# Patient Record
Sex: Female | Born: 1970 | Race: Black or African American | Hispanic: No | Marital: Single | Smoking: Never smoker
Health system: Southern US, Community
[De-identification: ages and names within clinical notes are randomized; demographics above are authoritative.]

## PROBLEM LIST (undated history)

## (undated) DIAGNOSIS — E785 Hyperlipidemia, unspecified: Secondary | ICD-10-CM

## (undated) DIAGNOSIS — F329 Major depressive disorder, single episode, unspecified: Secondary | ICD-10-CM

## (undated) DIAGNOSIS — K219 Gastro-esophageal reflux disease without esophagitis: Secondary | ICD-10-CM

## (undated) DIAGNOSIS — F419 Anxiety disorder, unspecified: Secondary | ICD-10-CM

## (undated) DIAGNOSIS — I10 Essential (primary) hypertension: Secondary | ICD-10-CM

## (undated) DIAGNOSIS — E669 Obesity, unspecified: Secondary | ICD-10-CM

## (undated) DIAGNOSIS — G4730 Sleep apnea, unspecified: Secondary | ICD-10-CM

## (undated) DIAGNOSIS — F32A Depression, unspecified: Secondary | ICD-10-CM

## (undated) HISTORY — PX: OTHER SURGICAL HISTORY: SHX169

---
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
## 1994-07-31 HISTORY — PX: TUBAL LIGATION: SHX77

## 1997-12-07 ENCOUNTER — Other Ambulatory Visit: Admission: RE | Admit: 1997-12-07 | Discharge: 1997-12-07 | Payer: Self-pay | Admitting: Family Medicine

## 1998-12-20 ENCOUNTER — Emergency Department (HOSPITAL_COMMUNITY): Admission: EM | Admit: 1998-12-20 | Discharge: 1998-12-20 | Payer: Self-pay | Admitting: Emergency Medicine

## 1998-12-20 ENCOUNTER — Encounter: Payer: Self-pay | Admitting: Emergency Medicine

## 2002-08-19 ENCOUNTER — Other Ambulatory Visit: Admission: RE | Admit: 2002-08-19 | Discharge: 2002-08-19 | Payer: Self-pay | Admitting: Family Medicine

## 2005-10-03 ENCOUNTER — Ambulatory Visit: Payer: Self-pay | Admitting: Family Medicine

## 2005-10-06 ENCOUNTER — Ambulatory Visit: Payer: Self-pay | Admitting: Family Medicine

## 2005-10-09 ENCOUNTER — Ambulatory Visit: Payer: Self-pay | Admitting: Family Medicine

## 2006-06-01 ENCOUNTER — Ambulatory Visit: Payer: Self-pay | Admitting: Family Medicine

## 2006-06-06 ENCOUNTER — Ambulatory Visit: Payer: Self-pay | Admitting: Family Medicine

## 2006-10-30 ENCOUNTER — Ambulatory Visit: Payer: Self-pay | Admitting: Family Medicine

## 2006-11-01 ENCOUNTER — Ambulatory Visit: Payer: Self-pay | Admitting: Family Medicine

## 2006-11-13 ENCOUNTER — Ambulatory Visit: Payer: Self-pay | Admitting: Family Medicine

## 2006-11-13 ENCOUNTER — Other Ambulatory Visit: Admission: RE | Admit: 2006-11-13 | Discharge: 2006-11-13 | Payer: Self-pay | Admitting: Family Medicine

## 2006-11-13 LAB — CONVERTED CEMR LAB

## 2007-01-22 ENCOUNTER — Ambulatory Visit: Payer: Self-pay | Admitting: Family Medicine

## 2007-04-15 ENCOUNTER — Telehealth (INDEPENDENT_AMBULATORY_CARE_PROVIDER_SITE_OTHER): Payer: Self-pay | Admitting: *Deleted

## 2007-04-16 ENCOUNTER — Encounter (INDEPENDENT_AMBULATORY_CARE_PROVIDER_SITE_OTHER): Payer: Self-pay | Admitting: Family Medicine

## 2007-04-16 DIAGNOSIS — E669 Obesity, unspecified: Secondary | ICD-10-CM | POA: Insufficient documentation

## 2007-04-16 DIAGNOSIS — J45909 Unspecified asthma, uncomplicated: Secondary | ICD-10-CM | POA: Insufficient documentation

## 2007-04-16 DIAGNOSIS — Z9089 Acquired absence of other organs: Secondary | ICD-10-CM

## 2007-04-22 ENCOUNTER — Ambulatory Visit: Payer: Self-pay | Admitting: Family Medicine

## 2007-04-22 DIAGNOSIS — I10 Essential (primary) hypertension: Secondary | ICD-10-CM | POA: Insufficient documentation

## 2007-04-22 DIAGNOSIS — H9190 Unspecified hearing loss, unspecified ear: Secondary | ICD-10-CM | POA: Insufficient documentation

## 2007-04-22 DIAGNOSIS — H6690 Otitis media, unspecified, unspecified ear: Secondary | ICD-10-CM | POA: Insufficient documentation

## 2007-04-22 DIAGNOSIS — J309 Allergic rhinitis, unspecified: Secondary | ICD-10-CM | POA: Insufficient documentation

## 2007-10-19 ENCOUNTER — Emergency Department (HOSPITAL_COMMUNITY): Admission: EM | Admit: 2007-10-19 | Discharge: 2007-10-19 | Payer: Self-pay | Admitting: Emergency Medicine

## 2008-04-09 ENCOUNTER — Emergency Department (HOSPITAL_COMMUNITY): Admission: EM | Admit: 2008-04-09 | Discharge: 2008-04-10 | Payer: Self-pay | Admitting: Family Medicine

## 2008-06-15 ENCOUNTER — Ambulatory Visit: Payer: Self-pay | Admitting: Family Medicine

## 2008-06-15 DIAGNOSIS — R002 Palpitations: Secondary | ICD-10-CM

## 2008-06-17 LAB — CONVERTED CEMR LAB
ALT: 8 units/L (ref 0–35)
Albumin: 4.1 g/dL (ref 3.5–5.2)
Alkaline Phosphatase: 55 units/L (ref 39–117)
Amphetamine Screen, Ur: NEGATIVE
Barbiturate Quant, Ur: NEGATIVE
Basophils Absolute: 0.1 10*3/uL (ref 0.0–0.1)
CO2: 21 meq/L (ref 19–32)
Eosinophils Relative: 4 % (ref 0–5)
Glucose, Bld: 101 mg/dL — ABNORMAL HIGH (ref 70–99)
HCT: 47.5 % — ABNORMAL HIGH (ref 36.0–46.0)
Lymphocytes Relative: 34 % (ref 12–46)
Lymphs Abs: 3.3 10*3/uL (ref 0.7–4.0)
Marijuana Metabolite: NEGATIVE
Methadone: NEGATIVE
Neutrophils Relative %: 54 % (ref 43–77)
Platelets: 422 10*3/uL — ABNORMAL HIGH (ref 150–400)
Potassium: 4.4 meq/L (ref 3.5–5.3)
Propoxyphene: NEGATIVE
RDW: 12.4 % (ref 11.5–15.5)
Sodium: 142 meq/L (ref 135–145)
TSH: 0.645 microintl units/mL (ref 0.350–4.50)
Total Bilirubin: 0.5 mg/dL (ref 0.3–1.2)
Total Protein: 7.6 g/dL (ref 6.0–8.3)
WBC: 9.6 10*3/uL (ref 4.0–10.5)

## 2008-06-24 ENCOUNTER — Ambulatory Visit: Payer: Self-pay | Admitting: Nurse Practitioner

## 2008-06-24 DIAGNOSIS — R42 Dizziness and giddiness: Secondary | ICD-10-CM | POA: Insufficient documentation

## 2008-06-24 LAB — CONVERTED CEMR LAB
Nitrite: NEGATIVE
Protein, U semiquant: NEGATIVE
Urobilinogen, UA: 0.2

## 2008-07-30 ENCOUNTER — Ambulatory Visit: Payer: Self-pay | Admitting: Family Medicine

## 2008-08-03 LAB — CONVERTED CEMR LAB
BUN: 11 mg/dL (ref 6–23)
Bilirubin, Direct: 0.2 mg/dL (ref 0.0–0.3)
CO2: 18 meq/L — ABNORMAL LOW (ref 19–32)
Chloride: 105 meq/L (ref 96–112)
Glucose, Bld: 97 mg/dL (ref 70–99)
Indirect Bilirubin: 0.4 mg/dL (ref 0.0–0.9)
LDL Cholesterol: 161 mg/dL — ABNORMAL HIGH (ref 0–99)
Potassium: 3.7 meq/L (ref 3.5–5.3)
TSH: 0.863 microintl units/mL (ref 0.350–4.50)
Total Bilirubin: 0.6 mg/dL (ref 0.3–1.2)
Total Protein: 7.8 g/dL (ref 6.0–8.3)
Triglycerides: 105 mg/dL (ref <150)
VLDL: 21 mg/dL (ref 0–40)

## 2009-05-06 ENCOUNTER — Encounter: Payer: Self-pay | Admitting: Physician Assistant

## 2009-05-11 ENCOUNTER — Telehealth: Payer: Self-pay | Admitting: Physician Assistant

## 2009-05-20 ENCOUNTER — Ambulatory Visit: Payer: Self-pay | Admitting: Nurse Practitioner

## 2009-05-20 ENCOUNTER — Encounter: Payer: Self-pay | Admitting: Physician Assistant

## 2009-05-31 LAB — CONVERTED CEMR LAB
ALT: 8 units/L (ref 0–35)
AST: 13 units/L (ref 0–37)
Albumin: 3.7 g/dL (ref 3.5–5.2)
CO2: 22 meq/L (ref 19–32)
Calcium: 9.1 mg/dL (ref 8.4–10.5)
HDL: 43 mg/dL (ref >39)
Potassium: 4.4 meq/L (ref 3.5–5.3)
Sodium: 141 meq/L (ref 135–145)
Total Bilirubin: 0.8 mg/dL (ref 0.3–1.2)
Total CHOL/HDL Ratio: 5

## 2009-10-04 ENCOUNTER — Ambulatory Visit: Payer: Self-pay | Admitting: Physician Assistant

## 2009-10-04 ENCOUNTER — Other Ambulatory Visit: Admission: RE | Admit: 2009-10-04 | Discharge: 2009-10-04 | Payer: Self-pay | Admitting: Internal Medicine

## 2009-10-04 DIAGNOSIS — K219 Gastro-esophageal reflux disease without esophagitis: Secondary | ICD-10-CM | POA: Insufficient documentation

## 2009-10-04 DIAGNOSIS — R82998 Other abnormal findings in urine: Secondary | ICD-10-CM | POA: Insufficient documentation

## 2009-10-04 DIAGNOSIS — N760 Acute vaginitis: Secondary | ICD-10-CM | POA: Insufficient documentation

## 2009-10-04 DIAGNOSIS — F411 Generalized anxiety disorder: Secondary | ICD-10-CM | POA: Insufficient documentation

## 2009-10-04 DIAGNOSIS — E785 Hyperlipidemia, unspecified: Secondary | ICD-10-CM

## 2009-10-05 ENCOUNTER — Encounter: Payer: Self-pay | Admitting: Physician Assistant

## 2009-10-08 ENCOUNTER — Encounter: Payer: Self-pay | Admitting: Physician Assistant

## 2009-10-18 ENCOUNTER — Ambulatory Visit: Payer: Self-pay | Admitting: Physician Assistant

## 2009-10-19 ENCOUNTER — Ambulatory Visit: Payer: Self-pay | Admitting: Physician Assistant

## 2009-10-28 ENCOUNTER — Ambulatory Visit: Payer: Self-pay | Admitting: Physician Assistant

## 2009-10-29 ENCOUNTER — Encounter: Payer: Self-pay | Admitting: Physician Assistant

## 2009-11-09 ENCOUNTER — Ambulatory Visit: Payer: Self-pay | Admitting: Physician Assistant

## 2009-11-25 ENCOUNTER — Ambulatory Visit: Payer: Self-pay | Admitting: Physician Assistant

## 2009-12-07 ENCOUNTER — Ambulatory Visit: Payer: Self-pay | Admitting: Physician Assistant

## 2010-02-01 ENCOUNTER — Emergency Department (HOSPITAL_COMMUNITY): Admission: EM | Admit: 2010-02-01 | Discharge: 2010-02-01 | Payer: Self-pay | Admitting: Emergency Medicine

## 2010-02-03 ENCOUNTER — Telehealth: Payer: Self-pay | Admitting: Physician Assistant

## 2010-03-21 ENCOUNTER — Telehealth: Payer: Self-pay | Admitting: Physician Assistant

## 2010-04-12 ENCOUNTER — Ambulatory Visit: Payer: Self-pay | Admitting: Physician Assistant

## 2010-04-12 LAB — CONVERTED CEMR LAB
BUN: 14 mg/dL (ref 6–23)
CO2: 25 meq/L (ref 19–32)
Calcium: 9.2 mg/dL (ref 8.4–10.5)
Chloride: 106 meq/L (ref 96–112)
Creatinine, Ser: 0.88 mg/dL (ref 0.40–1.20)
Glucose, Bld: 81 mg/dL (ref 70–99)
TSH: 0.466 microintl units/mL (ref 0.350–4.500)

## 2010-04-13 ENCOUNTER — Encounter: Payer: Self-pay | Admitting: Physician Assistant

## 2010-05-02 ENCOUNTER — Ambulatory Visit: Payer: Self-pay | Admitting: Physician Assistant

## 2010-05-02 DIAGNOSIS — N3000 Acute cystitis without hematuria: Secondary | ICD-10-CM

## 2010-05-02 LAB — CONVERTED CEMR LAB
Glucose, Urine, Semiquant: NEGATIVE
Nitrite: POSITIVE
Urobilinogen, UA: 0.2

## 2010-05-03 ENCOUNTER — Encounter: Payer: Self-pay | Admitting: Physician Assistant

## 2010-05-16 ENCOUNTER — Encounter: Payer: Self-pay | Admitting: Physician Assistant

## 2010-05-17 ENCOUNTER — Ambulatory Visit: Payer: Self-pay | Admitting: Nurse Practitioner

## 2010-07-08 ENCOUNTER — Ambulatory Visit: Payer: Self-pay | Admitting: Nurse Practitioner

## 2010-08-12 ENCOUNTER — Ambulatory Visit: Admission: RE | Admit: 2010-08-12 | Payer: Self-pay | Source: Home / Self Care | Admitting: Nurse Practitioner

## 2010-08-28 LAB — CONVERTED CEMR LAB
Bilirubin Urine: NEGATIVE
KOH Prep: NEGATIVE
Nitrite: NEGATIVE
Specific Gravity, Urine: 1.025
Urobilinogen, UA: 1
pH: 5.5

## 2010-08-30 NOTE — Letter (Signed)
Summary: COUNSELING  COUNSELING   Imported By: Arta Bruce 12/13/2009 11:04:59  _____________________________________________________________________  External Attachment:    Type:   Image     Comment:   External Document

## 2010-08-30 NOTE — Assessment & Plan Note (Signed)
Summary: 4 WEEK FU///KT   Vital Signs:  Patient profile:   40 year old female Height:      66 inches Weight:      311 pounds Temp:     98.3 degrees F oral Pulse rate:   88 / minute Pulse rhythm:   regular Resp:     20 per minute BP sitting:   123 / 79  (left arm) Cuff size:   large  Vitals Entered By: Danielle Rojas (Dec 07, 2009 2:41 PM) CC: follow-up visit(4week) Is Patient Diabetic? No Pain Assessment Patient in pain? no       Does patient need assistance? Functional Status Self care Ambulation Normal   Primary Care Provider:  Tereso Newcomer PA-C  CC:  follow-up visit(4week).  History of Present Illness: Here for f/u. Tells me that Danielle Rojas has referred her to psych at [facility].  Has appt later this month. She states that she has less palps and feels like she is scared somewhat less. Still thinks she sees things that are not there. Denies any instructions given to her by her hallucinations. Has some symptoms of agoraphobia.  No suicidal thoughts.   Current Medications (verified): 1)  Proventil Hfa 108 (90 Base) Mcg/act Aers (Albuterol Sulfate) .Marland Kitchen.. 1-2 Puffs Every 4-6 Hours As Needed 2)  Loratadine 10 Mg Tabs (Loratadine) .Marland Kitchen.. 1 Tablet By Mouth Daily For Allergies 3)  Simvastatin 40 Mg Tabs (Simvastatin) .... Take 1 Tab By Mouth At Bedtime (Dr. Sharyn Lull) 4)  Metoprolol Succinate 100 Mg Xr24h-Tab (Metoprolol Succinate) .... Take 1 Tablet By Mouth Once A Day (Dr. Sharyn Lull) 5)  Omeprazole 20 Mg Cpdr (Omeprazole) .... Take 1 Tablet By Mouth Once A Day (Dr. Sharyn Lull) 6)  Zoloft 100 Mg Tabs (Sertraline Hcl) .... Take 1 Tablet By Mouth Once A Day  Allergies (verified): No Known Drug Allergies  Physical Exam  General:  alert and well-developed.   Head:  normocephalic and atraumatic.   Lungs:  normal breath sounds.   Heart:  normal rate and regular rhythm.   Neurologic:  alert & oriented X3 and cranial nerves II-XII intact.   Skin:  sebaceous cyst left chest  Psych:   not anxious appearing, not suicidal, and flat affect.     Impression & Recommendations:  Problem # 1:  ANXIETY (ICD-300.00) slight improvement with Zoloft does not seem to have as severe of panic attacks with med ? hallucinations are the same no deterioration in her mood no suicidal thoughts still seeing Danielle Rojas and has appt soon is being referred to psych and sees them in 2 weeks will hold off on changing any meds at this time and allow psych to eval and adjust meds accordingly  Her updated medication list for this problem includes:    Zoloft 100 Mg Tabs (Sertraline hcl) .Marland Kitchen... Take 1 tablet by mouth once a day  Complete Medication List: 1)  Proventil Hfa 108 (90 Base) Mcg/act Aers (Albuterol sulfate) .Marland Kitchen.. 1-2 puffs every 4-6 hours as needed 2)  Loratadine 10 Mg Tabs (Loratadine) .Marland Kitchen.. 1 tablet by mouth daily for allergies 3)  Simvastatin 40 Mg Tabs (Simvastatin) .... Take 1 tab by mouth at bedtime (dr. Sharyn Lull) 4)  Metoprolol Succinate 100 Mg Xr24h-tab (Metoprolol succinate) .... Take 1 tablet by mouth once a day (dr. Sharyn Lull) 5)  Omeprazole 20 Mg Cpdr (Omeprazole) .... Take 1 tablet by mouth once a day (dr. Sharyn Lull) 6)  Zoloft 100 Mg Tabs (Sertraline hcl) .... Take 1 tablet by mouth once a day  Patient Instructions: 1)  Continue same dose of Zoloft.  2)  See Danielle Rojas as scheduled. 3)  Keep initial appointment at the [facility]. 4)  Please schedule a follow-up appointment in 2 months with Danielle Rojas for anxiety and blood pressure. 5)

## 2010-08-30 NOTE — Progress Notes (Signed)
  Phone Note Call from Patient   Summary of Call: THE PT HAS STEP THROAT AND SHE WANTS TO BE SEEN AS POSSIBLE.  PT WENT TO THE HOSPITAL ON TUESDAY (AND GOT ANTIBIOTIC SHOT) BUT SHE IS UNDER A LOT OF PAIN.  [facility] [address] WEAVER PA-C  Initial call taken by: Manon Hilding,  February 03, 2010 4:11 PM  Follow-up for Phone Call        spokle with pt and she let me know she did recieved a antobotic shot of strep throat when she went to hospital on tuesday evening... pt says her symptoms are her haed and throat is hurting... [place] Shannon  February 04, 2010 4:27 PM i did advised pt to go to urgnet care since it was real close to 5... pt says she will go and give [place] a call on monday if she doesn't feel any better Follow-up by: [place] Shannon,  February 04, 2010 4:29 PM

## 2010-08-30 NOTE — Assessment & Plan Note (Signed)
Summary: UTI   Vital Signs:  Patient profile:   40 year old female Menstrual status:  regular LMP:     04/13/2010 Weight:      312.9 pounds Temp:     98.6 degrees F oral Pulse rate:   80 / minute Pulse rhythm:   regular Resp:     18 per minute BP sitting:   116 / 78  (left arm) Cuff size:   thigh  Vitals Entered By: Michelle Nasuti (May 02, 2010 2:03 PM) CC: pt c/o increase in urinary urgency and painful urination x 1 week. denies vag dc, odor, back pain, or lower abd. pain  Pain Assessment Patient in pain? no      LMP (date): 04/13/2010 LMP - Character: normal     Menstrual Status regular Enter LMP: 04/13/2010 Last PAP Result NEGATIVE FOR INTRAEPITHELIAL LESIONS OR MALIGNANCY.   Primary Care Provider:  Tereso Newcomer PA-C  CC:  pt c/o increase in urinary urgency and painful urination x 1 week. denies vag dc, odor, back pain, and or lower abd. pain .  History of Present Illness: Here for 1 week of urinary frequency and urgency.  Notes dysuria.  No hematuria.  No flank pain.  No fever or chills.  No recent antibxs.  Denies any allergies.  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  Physical Exam  General:  alert, well-developed, and well-nourished.   Head:  normocephalic and atraumatic.     Lungs:  normal breath sounds.   Heart:  normal rate and regular rhythm.   Abdomen:  soft and non-tender.   Msk:  no CVA tend to percussion bilat  Neurologic:  alert & oriented X3 and cranial nerves II-XII intact.   Psych:  normally interactive.     Impression & Recommendations:  Problem # 1:  ACUTE CYSTITIS (ICD-595.0)  check culture tx with bactrim x 3 days rpeat u/a in 2 weeks  Her updated medication list for this problem includes:    Bactrim Ds 800-160 Mg Tabs (Sulfamethoxazole-trimethoprim) .Marland Kitchen... Take 1 tablet by mouth two times a day  Orders: UA Dipstick w/o Micro (manual) (81191) T-Culture, Urine ([address]29-56213)  Complete Medication List: 1)  Proventil Hfa 108 (90 Base) Mcg/act Aers (Albuterol sulfate) .Marland Kitchen.. 1-2 puffs every 4-6 hours as needed 2)  Loratadine 10 Mg Tabs (Loratadine) .Marland Kitchen.. 1 tablet by mouth daily for allergies 3)  Simvastatin 40 Mg Tabs (Simvastatin) .... Take 1 tab by mouth at bedtime (dr. Sharyn Lull) 4)  Metoprolol Succinate 100 Mg Xr24h-tab (Metoprolol succinate) .... Take 1 tablet by mouth once a day (dr. Sharyn Lull) 5)  Omeprazole 20 Mg Cpdr (Omeprazole) .... Take 1 tablet by mouth once a day (dr. Sharyn Lull) 6)  Zoloft 100 Mg Tabs (Sertraline hcl) .... Take 1 and 1/2 tablets once daily 7)  Prinivil 10  Mg Tabs (Lisinopril) .Marland Kitchen.. 1 tablet by mouth twice daily 8)  Hydroxyzine Hcl 25 Mg Tabs (Hydroxyzine hcl) .... Take 1 tablet by mouth once a day as needed for anxiety 9)  Bactrim Ds 800-160 Mg Tabs (Sulfamethoxazole-trimethoprim) .... Take 1 tablet by mouth two times a day  Patient Instructions: 1)  Drink plenty of fluids up to 3-4 quarts a day. Cranberry juice is especially recommended in addition to large amounts of water. Avoid caffeine & carbonated drinks, they tend to irritate the bladder, Return in 3-5 days if you're not better: sooner if you're feeling worse.  2)  Take your antibiotic as prescribed until ALL of it is gone, but stop if you develop a rash or  swelling and contact our office as soon as possible.  3)  Return for repeat urinalysis in 2 weeks. Prescriptions: BACTRIM DS 800-160 MG TABS (SULFAMETHOXAZOLE-TRIMETHOPRIM) Take 1 tablet by mouth two times a day  #6 x 0   Entered and Authorized by:   Tereso Newcomer PA-C   Signed by:   Tereso Newcomer PA-C on 05/02/2010   Method used:   Print then Give to Patient   RxID:   [id number]   Laboratory Results   Urine Tests  Date/Time Received: May 02, 2010 Date/Time Reported: 2:15 PM   Routine Urinalysis   Color: yellow Appearance: Hazy Glucose: negative   (Normal Range: Negative) Bilirubin: negative   (Normal Range: Negative) Ketone: negative   (Normal Range: Negative) Spec. Gravity: >=1.030   (Normal Range: 1.003-1.035) Blood: small   (Normal Range: Negative) pH: 5.5   (Normal Range: 5.0-8.0) Protein: 30   (Normal Range: Negative) Urobilinogen: 0.2   (Normal Range: 0-1) Nitrite: positive   (Normal Range: Negative) Leukocyte Esterace: moderate   (Normal Range: Negative)

## 2010-08-30 NOTE — Assessment & Plan Note (Signed)
Summary: Anxiety and HTN   Vital Signs:  Patient profile:   40 year old female Height:      66 inches Weight:      312 pounds BMI:     50.54 Temp:     98.5 degrees F oral Pulse rate:   78 / minute Pulse rhythm:   regular Resp:     20 per minute BP sitting:   108 / 72  (left arm)  Vitals Entered By: CMA Student Linzie Collin CC: follow-up visit for BP, rashes behind the ear for about four months, Hypertension Management Is Patient Diabetic? No Pain Assessment Patient in pain? no       Does patient need assistance? Functional Status Self care Ambulation Normal   Primary Care Provider:  Tereso Newcomer PA-C  CC:  follow-up visit for BP, rashes behind the ear for about four months, and Hypertension Management.  History of Present Illness: Here for f/u. Only saw [facility] x 1.  Was told did not need to come back.  Still has a lot of fear.  ? panic attacks.  Has ? hallucinations still.  Feels like mood is ok.  Has not seen [facility].  Hypertension History:      She complains of headache, but denies chest pain, dyspnea with exertion, and syncope.  She notes no problems with any antihypertensive medication side effects.  Further comments include: Lisinopril added recently by cardiology.        Positive major cardiovascular risk factors include hyperlipidemia, hypertension, and current tobacco user.  Negative major cardiovascular risk factors include female age less than 81 years old and negative family history for ischemic heart disease.     Current Medications (verified): 1)  Proventil Hfa 108 (90 Base) Mcg/act Aers (Albuterol Sulfate) .Marland Kitchen.. 1-2 Puffs Every 4-6 Hours As Needed 2)  Loratadine 10 Mg Tabs (Loratadine) .Marland Kitchen.. 1 Tablet By Mouth Daily For Allergies 3)  Simvastatin 40 Mg Tabs (Simvastatin) .... Take 1 Tab By Mouth At Bedtime (Dr. Sharyn Lull) 4)  Metoprolol Succinate 100 Mg Xr24h-Tab (Metoprolol Succinate) .... Take 1 Tablet By Mouth Once A Day (Dr. Sharyn Lull) 5)  Omeprazole 20  Mg Cpdr (Omeprazole) .... Take 1 Tablet By Mouth Once A Day (Dr. Sharyn Lull) 6)  Zoloft 100 Mg Tabs (Sertraline Hcl) .... Take 1 Tablet By Mouth Once A Day 7)  Prinivil 10 Mg Tabs (Lisinopril) .Marland Kitchen.. 1 Tablet By Mouth Twice Daily  Allergies (verified): No Known Drug Allergies  Physical Exam  General:  alert and well-developed.   Head:  normocephalic and atraumatic.   Lungs:  normal breath sounds.   Heart:  normal rate and regular rhythm.   Abdomen:  soft, non-tender, normal bowel sounds, and no hepatomegaly.   Neurologic:  alert & oriented X3 and cranial nerves II-XII intact.   Psych:  normally interactive.     Impression & Recommendations:  Problem # 1:  ANXIETY (ICD-300.00) still scared about certain situations ? visual hallucinations never set up for routine care at [facility] . . . saw someone once only will increase zoloft to 150 add hydroxyzine to use as needed will send back to counselor  Her updated medication list for this problem includes:    Zoloft 100 Mg Tabs (Sertraline hcl) .Marland Kitchen... Take 1 and 1/2 tablets once daily    Hydroxyzine Hcl 25 Mg Tabs (Hydroxyzine hcl) .Marland Kitchen... Take 1 tablet by mouth once a day as needed for anxiety  Orders: T-TSH ([id number])  Problem # 2:  HYPERTENSION (ICD-401.9) controlled  Her  updated medication list for this problem includes:    Metoprolol Succinate 100 Mg Xr24h-tab (Metoprolol succinate) .Marland Kitchen... Take 1 tablet by mouth once a day (dr. Sharyn Lull)    Prinivil 10 Mg Tabs (Lisinopril) .Marland Kitchen... 1 tablet by mouth twice daily  Orders: T-Comprehensive Metabolic Panel ([id number]) T-TSH ([id number])  Problem # 3:  URINALYSIS, ABNORMAL (ICD-791.9) had protein in urine last time can't urinate will check next time  Complete Medication List: 1)  Proventil Hfa 108 (90 Base) Mcg/act Aers (Albuterol sulfate) .Marland Kitchen.. 1-2 puffs every 4-6 hours as needed 2)  Loratadine 10 Mg Tabs (Loratadine) .Marland Kitchen.. 1 tablet by mouth daily for allergies 3)  Simvastatin 40 Mg  Tabs (Simvastatin) .... Take 1 tab by mouth at bedtime (dr. Sharyn Lull) 4)  Metoprolol Succinate 100 Mg Xr24h-tab (Metoprolol succinate) .... Take 1 tablet by mouth once a day (dr. Sharyn Lull) 5)  Omeprazole 20 Mg Cpdr (Omeprazole) .... Take 1 tablet by mouth once a day (dr. Sharyn Lull) 6)  Zoloft 100 Mg Tabs (Sertraline hcl) .... Take 1 and 1/2 tablets once daily 7)  Prinivil 10 Mg Tabs (Lisinopril) .Marland Kitchen.. 1 tablet by mouth twice daily 8)  Hydroxyzine Hcl 25 Mg Tabs (Hydroxyzine hcl) .... Take 1 tablet by mouth once a day as needed for anxiety  Hypertension Assessment/Plan:      The patient's hypertensive risk group is category B: At least one risk factor (excluding diabetes) with no target organ damage.  Her calculated 10 year risk of coronary heart disease is 1 %.  Today's blood pressure is 108/72.  Her blood pressure goal is < 140/90.  Patient Instructions: 1)  Schedule follow up appt. with Ethelene Browns. 2)  Please schedule a follow-up appointment in 2 months with Jahmir Salo for anxiety.  Prescriptions: HYDROXYZINE HCL 25 MG TABS (HYDROXYZINE HCL) Take 1 tablet by mouth once a day as needed for anxiety  #30 x 0   Entered and Authorized by:   Tereso Newcomer PA-C   Signed by:   Tereso Newcomer PA-C on 04/12/2010   Method used:   Print then Give to Patient   RxID:   [id number] ZOLOFT 100 MG TABS (SERTRALINE HCL) Take 1 and 1/2 tablets once daily  #45 x 5   Entered and Authorized by:   Tereso Newcomer PA-C   Signed by:   Tereso Newcomer PA-C on 04/12/2010   Method used:   Print then Give to Patient   RxID:   [id number]

## 2010-08-30 NOTE — Letter (Signed)
Summary: *HSN Results Follow up  [facility]  [address] [place], [place] [address]   Phone: [phone]  Fax: [phone]      04/13/2010   [facility] [address] [place], [place]  [address]   Dear  Ms. [name],                            ____S.Drinkard,FNP   ____D. Gore,FNP       ____B. McPherson,MD   ____V. Rankins,MD    ____E. Mulberry,MD    ____N. Daphine Deutscher, FNP  ____D. Reche Dixon, MD    ____K. Philipp Deputy, MD    __x__S. Alben Spittle, PA-C     This letter is to inform you that your recent test(s):  _______Pap Smear    ___x____Lab Test     _______X-ray    ___x____ is within acceptable limits  _______ requires a medication change  _______ requires a follow-up lab visit  _______ requires a follow-up visit with your provider   Comments: Liver and kidney function and thyroid all normal.       _________________________________________________________ If you have any questions, please contact our office                     Sincerely,  Tereso Newcomer PA-C [facility]

## 2010-08-30 NOTE — Assessment & Plan Note (Signed)
Summary: Danielle Rojas PT/CPP///////RJP   Vital Signs:  Patient profile:   40 year old female Weight:      315.6 pounds BMI:     51.12 Temp:     98.9 degrees F Pulse rate:   101 / minute Pulse rhythm:   regular Resp:     20 per minute BP sitting:   132 / 84  (left arm) Cuff size:   large  Vitals Entered By: Vesta Mixer CMA (October 04, 2009 10:41 AM)  Serial Vital Signs/Assessments:  Comments: 12:16 PM Peak flows 1. 340  2. 345   3. 430 By: Vesta Mixer CMA    Primary Care Provider:  Beverley Fiedler MD   History of Present Illness: Here for CPP.  Palps:  Sent to cardiology when last seen in 2009.  Sees Dr. Sharyn Lull.  He has her on Metoprolol Succinate 100 mg once daily.  She just had an Echo recently.  No records rec'd.   High chol:  She is on simvastatin per Dr. Sharyn Lull.  He follows up on this.  GERD:  Dr. Sharyn Lull put her on Prilosec.  Takes as needed.  Symptoms controlled. No melena, hematochezia or hematemesis.    Anxiety:  Gets scared at times.  Has noticed since she started having palps.  She says she tenses up and gets scared.  Sounds like she is having panic attacks.  She gets scared coming into the doctor's office or driving down the highway.    Health maint:   No h/o abnormal paps.  Last in 2008. No abnormal bleeding, discharge, odor. No irregular periods. Sexually active with one partner.  No STD concerns.  No h/o STD. Does not take calcium. No FHx of breast or ovarian cancer. Td up to date. No flu shot this year.   Asthma History    Asthma Control Assessment:    Age range: 12+ years    Symptoms: 0-2 days/week    Nighttime Awakenings: 0-2/month    Interferes w/ normal activity: no limitations    SABA use (not for EIB): 0-2 days/week    Asthma Control Assessment: Well Controlled  Hypertension History:      Positive major cardiovascular risk factors include hyperlipidemia, hypertension, and current tobacco user.  Negative major cardiovascular risk  factors include female age less than 64 years old and negative family history for ischemic heart disease.     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) 10)  Otitis Media, Acute, Left  (ICD-382.9) 11)  Hypertension  (ICD-401.9) 12)  Rhinitis, Allergic Nos  (ICD-477.9) 13)  Cholecystectomy, Hx of  (ICD-V45.79) 14)  Tubal Ligation, Hx of  (ICD-V26.51) 15)  Obesity  (ICD-278.00) 16)  Asthma  (ICD-493.90)  Allergies: No Known Drug Allergies  Past History:  Past Medical History: Last updated: 04/16/2007 Current Problems:  RHINITIS, ALLERGIC NOS (ICD-477.9) CHOLECYSTECTOMY, HX OF (ICD-V45.79) TUBAL LIGATION, HX OF (ICD-V26.51) OBESITY (ICD-278.00) ASTHMA (ICD-493.90)  Past Surgical History: Last updated: 04/16/2007 Cholecystectomy Tubal ligation  Family History: Dad - brain and lung cancer No family history of thyroid problems.  Family History Diabetes 1st degree relative - mom Family History Hypertension - mom  Social History: Single Current Smoker - quit 2009  Alcohol use-no Drug use-no unemployed 3 kids  Review of Systems      See HPI General:  Denies chills and fever. CV:  Complains of palpitations; denies chest pain or discomfort, difficulty breathing while lying down, and fainting. Resp:  Denies cough. GI:  Denies bloody stools, dark tarry stools, and vomiting blood. GU:  Denies dysuria and hematuria. Derm:  Denies lesion(s). Psych:  Denies suicidal thoughts/plans. Endo:  Denies cold intolerance and heat intolerance.  Physical Exam  General:  alert, well-developed, and well-nourished.   Head:  normocephalic and atraumatic.   Eyes:  pupils equal,  pupils round, pupils reactive to light, and no retinal abnormalitiies.   Ears:  R ear normal and L ear normal.   Nose:  no external deformity.   Mouth:  pharynx pink and moist, no erythema, and no exudates.   Neck:  supple, no thyromegaly, no JVD, no carotid bruits, and no cervical lymphadenopathy.   Breasts:  skin/areolae normal, no masses, no abnormal thickening, no nipple discharge, no tenderness, and no adenopathy.   Lungs:  normal breath sounds, no crackles, and no wheezes.   Heart:  normal rate, regular rhythm, and no murmur.   Abdomen:  soft, non-tender, normal bowel sounds, and no hepatomegaly.   Rectal:  no external abnormalities.   Genitalia:  copious, thin, grey-white  normal introitus, no external lesions, mucosa pink and moist, no vaginal or cervical lesions, no vaginal atrophy, no friaility or hemorrhage, and vaginal discharge.   due to body habitus, unable to palpate fundus or adnexae Msk:  normal ROM.   Extremities:  no edema  Neurologic:  alert & oriented X3, cranial nerves II-XII intact, strength normal in all extremities, and DTRs symmetrical and normal.   Skin:  turgor normal.   Psych:  normally interactive.     Impression & Recommendations:  Problem # 1:  PALPITATIONS (ICD-785.1)  followed by Dr. Sharyn Lull need records cont Toprol per his direction  Her updated medication list for this problem includes:    Metoprolol Succinate 100 Mg Xr24h-tab (Metoprolol succinate) .Marland Kitchen... Take 1 tablet by mouth once a day (dr. Sharyn Lull)  Problem # 2:  HYPERTENSION (ICD-401.9)  on Toprol per Dr. Sharyn Lull  Her updated medication list for this problem includes:    Metoprolol Succinate 100 Mg Xr24h-tab (Metoprolol succinate) .Marland Kitchen... Take 1 tablet by mouth once a day (dr. Sharyn Lull)  Orders: EKG w/ Interpretation (93000)  Problem # 3:  HYPERLIPIDEMIA (ICD-272.4)  on simvastatin per Dr. Sharyn Lull lipids and LFTs followed by him  Her updated medication list for this problem  includes:    Simvastatin 40 Mg Tabs (Simvastatin) .Marland Kitchen... Take 1 tab by mouth at bedtime (dr. Sharyn Lull)  Her updated medication list for this problem includes:    Simvastatin 40 Mg Tabs (Simvastatin) .Marland Kitchen... Take 1 tab by mouth at bedtime (dr. Sharyn Lull)  Problem # 4:  GERD (ICD-530.81)  on PPI per Dr. Sharyn Lull symptoms controlled on as needed dosing  Her updated medication list for this problem includes:    Omeprazole 20 Mg Cpdr (Omeprazole) .Marland Kitchen... Take 1 tablet by mouth once a day (dr. Sharyn Lull)  Problem # 5:  ASTHMA (ICD-493.90) controlled symptoms does not have advair or albuterol will fill Proventil to use as needed  The following medications were removed from the medication list:    Advair Diskus 250-50 Mcg/dose Misc (Fluticasone-salmeterol) .Marland Kitchen... Take one inhalation two times a day  rinse and spit after use Her updated medication list for this problem includes:  Proventil Hfa 108 (90 Base) Mcg/act Aers (Albuterol sulfate) .Marland Kitchen... 1-2 puffs every 4-6 hours as needed  Problem # 6:  ANXIETY (ICD-300.00)  I think she has panic disorder may be cause of her palps she often has feelings of impending doom start zoloft close f/u with me refer to LCSW  Orders: Psychology Referral (Psychology)  Her updated medication list for this problem includes:    Zoloft 50 Mg Tabs (Sertraline hcl) .Marland Kitchen... Take 1 tablet by mouth once a day  Problem # 7:  VAGINITIS, BACTERIAL (ICD-616.10)  no complaints of discharge but she has a large amount of discharge and clue cells ? if body habitus makes it difficult to notice discharge will go ahead and treat  Her updated medication list for this problem includes:    Flagyl 500 Mg Tabs (Metronidazole) .Marland Kitchen... Take 1 tablet by mouth two times a day  Problem # 8:  PREVENTIVE HEALTH CARE (ICD-V70.0) PHQ9=2 flu shot today  Orders: KOH/ WET Mount [phone]) T- GC Chlamydia (81017) T-Pap Smear, Thin Prep (51025) T-HIV Antibody  (Reflex)  [phone]) T-Syphilis Test (RPR) [phone]) T-Urinalysis (81003-65000)  Complete Medication List: 1)  Proventil Hfa 108 (90 Base) Mcg/act Aers (Albuterol sulfate) .Marland Kitchen.. 1-2 puffs every 4-6 hours as needed 2)  Loratadine 10 Mg Tabs (Loratadine) .Marland Kitchen.. 1 tablet by mouth daily for allergies 3)  Simvastatin 40 Mg Tabs (Simvastatin) .... Take 1 tab by mouth at bedtime (dr. Sharyn Lull) 4)  Metoprolol Succinate 100 Mg Xr24h-tab (Metoprolol succinate) .... Take 1 tablet by mouth once a day (dr. Sharyn Lull) 5)  Omeprazole 20 Mg Cpdr (Omeprazole) .... Take 1 tablet by mouth once a day (dr. Sharyn Lull) 6)  Zoloft 50 Mg Tabs (Sertraline hcl) .... Take 1 tablet by mouth once a day 7)  Flagyl 500 Mg Tabs (Metronidazole) .... Take 1 tablet by mouth two times a day  Other Orders: T-Culture, Urine ([id number])  Hypertension Assessment/Plan:      The patient's hypertensive risk group is category B: At least one risk factor (excluding diabetes) with no target organ damage.  Her calculated 10 year risk of coronary heart disease is 2 %.  Today's blood pressure is 132/84.  Her blood pressure goal is < 140/90.   Patient Instructions: 1)  Schedule appt with Ethelene Browns. 2)  Please schedule a follow-up appointment in 2 weeks with Lorin Picket for anxiety. 3)  Take calcium 600 mg + Vit D 400 International Units two times a day. 4)  Flu shot today. 5)  Please sign form to get records from Dr. Sharyn Lull. Prescriptions: FLAGYL 500 MG TABS (METRONIDAZOLE) Take 1 tablet by mouth two times a day  #14 x 0   Entered and Authorized by:   Tereso Newcomer PA-C   Signed by:   Tereso Newcomer PA-C on 10/04/2009   Method used:   Print then Give to Patient   RxID:   [id number] ZOLOFT 50 MG TABS (SERTRALINE HCL) Take 1 tablet by mouth once a day  #30 x 3   Entered and Authorized by:   Tereso Newcomer PA-C   Signed by:   Tereso Newcomer PA-C on 10/04/2009   Method used:   Print then Give to Patient   RxID:   [id number] PROVENTIL HFA  108 (90 BASE) MCG/ACT AERS (ALBUTEROL SULFATE) 1-2 puffs every 4-6 hours as needed  #1 x 5   Entered and Authorized by:   Tereso Newcomer PA-C   Signed by:   Tereso Newcomer PA-C on 10/04/2009   Method used:  Print then Give to Patient   RxID:   [id number]    EKG  Procedure date:  10/04/2009  Findings:      Normal sinus rhythm with rate of:  87 normal axis no ischemic changes    Laboratory Results   Urine Tests    Routine Urinalysis   Glucose: negative   (Normal Range: Negative) Bilirubin: negative   (Normal Range: Negative) Ketone: negative   (Normal Range: Negative) Spec. Gravity: 1.025   (Normal Range: 1.003-1.035) Blood: negative   (Normal Range: Negative) pH: 5.5   (Normal Range: 5.0-8.0) Protein: 30   (Normal Range: Negative) Urobilinogen: 1.0   (Normal Range: 0-1) Nitrite: negative   (Normal Range: Negative) Leukocyte Esterace: trace   (Normal Range: Negative)      Wet Mount Source: vaginal WBC/hpf: 1-5 Bacteria/hpf: 1+ Clue cells/hpf: moderate  Negative whiff Yeast/hpf: none Wet Mount KOH: Negative Trichomonas/hpf: none

## 2010-08-30 NOTE — Letter (Signed)
Summary: *HSN Results Follow up  [facility]  [address] [place], [place] [address]   Phone: [phone]  Fax: [phone]      10/08/2009   [facility] [address] [place], [place]  [address]   Dear  Ms. [name],                            ____S.Drinkard,FNP   ____D. Gore,FNP       ____B. McPherson,MD   ____V. Rankins,MD    ____E. Mulberry,MD    ____N. Daphine Deutscher, FNP  ____D. Reche Dixon, MD    ____K. Philipp Deputy, MD    __x__S. Uzziah Rigg     This letter is to inform you that your recent test(s):  ___x____Pap Smear    _______Lab Test     _______X-ray    ___x____ is within acceptable limits  _______ requires a medication change  _______ requires a follow-up lab visit  _______ requires a follow-up visit with your provider   Comments:       _________________________________________________________ If you have any questions, please contact our office                     Sincerely,  Tereso Newcomer PA-C [facility]

## 2010-08-30 NOTE — Assessment & Plan Note (Signed)
Summary: Saw Ethelene Browns   Allergies: No Known Drug Allergies   Complete Medication List: 1)  Proventil Hfa 108 (90 Base) Mcg/act Aers (Albuterol sulfate) .Marland Kitchen.. 1-2 puffs every 4-6 hours as needed 2)  Loratadine 10 Mg Tabs (Loratadine) .Marland Kitchen.. 1 tablet by mouth daily for allergies 3)  Simvastatin 40 Mg Tabs (Simvastatin) .... Take 1 tab by mouth at bedtime (dr. Sharyn Lull) 4)  Metoprolol Succinate 100 Mg Xr24h-tab (Metoprolol succinate) .... Take 1 tablet by mouth once a day (dr. Sharyn Lull) 5)  Omeprazole 20 Mg Cpdr (Omeprazole) .... Take 1 tablet by mouth once a day (dr. Sharyn Lull) 6)  Zoloft 50 Mg Tabs (Sertraline hcl) .... Take 1 tablet by mouth once a day

## 2010-08-30 NOTE — Progress Notes (Signed)
Summary: Office Visit//DEPRESSION SCREENING  Office Visit//DEPRESSION SCREENING   Imported By: Arta Bruce 11/29/2009 16:55:20  _____________________________________________________________________  External Attachment:    Type:   Image     Comment:   External Document

## 2010-08-30 NOTE — Assessment & Plan Note (Signed)
Summary: FU VISIT IN 3 WKS WITH Nolton Denis FOR ANXIETY//GK   Vital Signs:  Patient profile:   40 year old female Height:      66 inches Weight:      310 pounds BMI:     50.22 O2 Sat:      100 % on Room air Temp:     98.1 degrees F oral Pulse rate:   88 / minute Pulse rhythm:   regular Resp:     20 per minute BP sitting:   132 / 88  (left arm) Cuff size:   large  Vitals Entered By: [place] Shannon (November 09, 2009 2:28 PM)  O2 Flow:  Room air CC: f/u... Is Patient Diabetic? No Pain Assessment Patient in pain? no       Does patient need assistance? Functional Status Self care Ambulation Normal   Primary Care Provider:  Tereso Newcomer PA-C  CC:  f/u....  History of Present Illness: Here for f/u on anxiety. Spoke with AAnanias Pilgrim a couple weeks ago.  Patient having some hallucinations and some questionable behavior concerning for dissociation. She notes her anxiety is better with the zoloft.  She does feel tired.  Otherwise, no side effects. She continues to feel scared.  She is afraid of elevators.   She denies auditory hallucinations but does have fleeting thoughts that she states she is able to avoid.  For example, she was with her boyfriend today at his parole hearing.  She thought about taking the officer's gun but was able to redirect herself and knew it would be wrong. She does admit to seeing ghosts.  She sees them quite often.   Current Medications (verified): 1)  Proventil Hfa 108 (90 Base) Mcg/act Aers (Albuterol Sulfate) .Marland Kitchen.. 1-2 Puffs Every 4-6 Hours As Needed 2)  Loratadine 10 Mg Tabs (Loratadine) .Marland Kitchen.. 1 Tablet By Mouth Daily For Allergies 3)  Simvastatin 40 Mg Tabs (Simvastatin) .... Take 1 Tab By Mouth At Bedtime (Dr. Sharyn Lull) 4)  Metoprolol Succinate 100 Mg Xr24h-Tab (Metoprolol Succinate) .... Take 1 Tablet By Mouth Once A Day (Dr. Sharyn Lull) 5)  Omeprazole 20 Mg Cpdr (Omeprazole) .... Take 1 Tablet By Mouth Once A Day (Dr. Sharyn Lull) 6)  Zoloft 50 Mg Tabs  (Sertraline Hcl) .... Take 1 Tablet By Mouth Once A Day  Allergies (verified): No Known Drug Allergies  Family History: Dad - brain and lung cancer No family history of thyroid problems.  Family History Diabetes 1st degree relative - mom Family History Hypertension - mom Uncle - ? psychosis  Physical Exam  General:  alert, well-developed, and well-nourished.   Head:  normocephalic and atraumatic.   Neck:  supple.   Lungs:  normal breath sounds.   Heart:  normal rate and regular rhythm.   Extremities:  no edema Neurologic:  alert & oriented X3 and cranial nerves II-XII intact.   Psych:  normally interactive and flat affect.     Impression & Recommendations:  Problem # 1:  ANXIETY (ICD-300.00) she does have some paranoia and some hallucinations A. Vaughan raised questions of dissoc. behavior at times referral to psychiatry is pending her anxiety is better on the zoloft will increase dose to 100 mg once daily and f/u with me in 4 weeks  Her updated medication list for this problem includes:    Zoloft 100 Mg Tabs (Sertraline hcl) .Marland Kitchen... Take 1 tablet by mouth once a day  Complete Medication List: 1)  Proventil Hfa 108 (90 Base) Mcg/act Aers (Albuterol sulfate) .Marland KitchenMarland KitchenMarland Kitchen  1-2 puffs every 4-6 hours as needed 2)  Loratadine 10 Mg Tabs (Loratadine) .Marland Kitchen.. 1 tablet by mouth daily for allergies 3)  Simvastatin 40 Mg Tabs (Simvastatin) .... Take 1 tab by mouth at bedtime (dr. Sharyn Lull) 4)  Metoprolol Succinate 100 Mg Xr24h-tab (Metoprolol succinate) .... Take 1 tablet by mouth once a day (dr. Sharyn Lull) 5)  Omeprazole 20 Mg Cpdr (Omeprazole) .... Take 1 tablet by mouth once a day (dr. Sharyn Lull) 6)  Zoloft 100 Mg Tabs (Sertraline hcl) .... Take 1 tablet by mouth once a day  Patient Instructions: 1)  Make sure you have follow up with [name] in next 2 weeks. 2)  Increase Zoloft to 100 mg a day (you can take 2 of  the 50 mg tablets to equal 100 mg). 3)  Schedule a follow up appointment with Annette Bertelson  in 4 weeks. Prescriptions: ZOLOFT 100 MG TABS (SERTRALINE HCL) Take 1 tablet by mouth once a day  #30 x 3   Entered and Authorized by:   Tereso Newcomer PA-C   Signed by:   Tereso Newcomer PA-C on 11/09/2009   Method used:   Print then Give to Patient   RxID:   [id number]

## 2010-08-30 NOTE — Assessment & Plan Note (Signed)
Summary: 2 WK F/U ANXIETY///RJP   Vital Signs:  Patient profile:   40 year old female Height:      66 inches Weight:      313 pounds BMI:     50.70 Temp:     98.2 degrees F oral Pulse rate:   81 / minute Pulse rhythm:   regular Resp:     18 per minute BP sitting:   135 / 83  (left arm) Cuff size:   large  Vitals Entered By: Armenia Shannon (October 19, 2009 9:34 AM) CC: f/u on anixety.... pt says she still feels the same... Is Patient Diabetic? No Pain Assessment Patient in pain? no       Does patient need assistance? Functional Status Self care Ambulation Normal   Primary Care Provider:  Tereso Newcomer PA-C  CC:  f/u on anixety.... pt says she still feels the same....  History of Present Illness: Here for f/u on anxiety. Saw Amanda yest.  She is to f/u next week.  She does have some questionable hallucinations at times.  She states that she thinks she might see a bug in the dark and then turn on the light and realize it was something else or she might hear her mom calling her but her mom did not say anything.  She really does not describe and visual or auditory hallucinations that sound pathologic.  She does not have a FHx of schizophrenia.   She feels like her mood is the same.  She denies suicidal ideations. She denies any side effects to the medicine. She is having trouble sleeping at times.  This does not occur every night.  She does watch tv in bed.  No caffeine or exercise before bed. She notes that she is no longer having chest pain since she started the zoloft.  Current Medications (verified): 1)  Proventil Hfa 108 (90 Base) Mcg/act Aers (Albuterol Sulfate) .Marland Kitchen.. 1-2 Puffs Every 4-6 Hours As Needed 2)  Loratadine 10 Mg Tabs (Loratadine) .Marland Kitchen.. 1 Tablet By Mouth Daily For Allergies 3)  Simvastatin 40 Mg Tabs (Simvastatin) .... Take 1 Tab By Mouth At Bedtime (Dr. Sharyn Lull) 4)  Metoprolol Succinate 100 Mg Xr24h-Tab (Metoprolol Succinate) .... Take 1 Tablet By Mouth Once A  Day (Dr. Sharyn Lull) 5)  Omeprazole 20 Mg Cpdr (Omeprazole) .... Take 1 Tablet By Mouth Once A Day (Dr. Sharyn Lull) 6)  Zoloft 50 Mg Tabs (Sertraline Hcl) .... Take 1 Tablet By Mouth Once A Day  Allergies (verified): No Known Drug Allergies  Physical Exam  General:  alert, well-developed, and well-nourished.   Head:  normocephalic and atraumatic.   Lungs:  normal breath sounds.   Heart:  normal rate and regular rhythm.   Neurologic:  alert & oriented X3 and cranial nerves II-XII intact.   Psych:  normally interactive.     Impression & Recommendations:  Problem # 1:  ANXIETY (ICD-300.00) tolerating zoloft continue counseling continue med try benadryl for sleep as needed f/u with me in 3 weeks to reassess response to med ? add'n of buspar at that time if anxiety continues to uncontrolled  Her updated medication list for this problem includes:    Zoloft 50 Mg Tabs (Sertraline hcl) .Marland Kitchen... Take 1 tablet by mouth once a day  Complete Medication List: 1)  Proventil Hfa 108 (90 Base) Mcg/act Aers (Albuterol sulfate) .Marland Kitchen.. 1-2 puffs every 4-6 hours as needed 2)  Loratadine 10 Mg Tabs (Loratadine) .Marland Kitchen.. 1 tablet by mouth daily for allergies 3)  Simvastatin  40 Mg Tabs (Simvastatin) .... Take 1 tab by mouth at bedtime (dr. Sharyn Lull) 4)  Metoprolol Succinate 100 Mg Xr24h-tab (Metoprolol succinate) .... Take 1 tablet by mouth once a day (dr. Sharyn Lull) 5)  Omeprazole 20 Mg Cpdr (Omeprazole) .... Take 1 tablet by mouth once a day (dr. Sharyn Lull) 6)  Zoloft 50 Mg Tabs (Sertraline hcl) .... Take 1 tablet by mouth once a day  Patient Instructions: 1)  Please schedule a follow-up appointment in 3 weeks with Scott for anxiety.  2)  Follow up with [name] as scheduled.

## 2010-08-30 NOTE — Miscellaneous (Signed)
Summary: Psychology Follow Up   Spoke with Danielle Rojas today. Patient expressing some behaviors that concerned her. She is having some ques. hallucinations. Also speaks in different voice at times and will turn on appliances at home and ask her spouse why he turned them on.  She is concerned about dissociative behaviors. She is referring the patient to psychiatry. She also reported to [facility] that she has some type of head tremor when she concentrates on things. Will need to d/w her at her f/u.  Clinical Lists Changes  Problems: Assessed ANXIETY as comment only - Spoke with Danielle Rojas today. Patient expressing some behaviors that concerned her. She is having some ques. hallucinations. Also speaks in different voice at times and will turn on appliances at home and ask her spouse why he turned them on.  She is concerned about dissociative behaviors. She is referring the patient to psychiatry.  Her updated medication list for this problem includes:    Zoloft 50 Mg Tabs (Sertraline hcl) .Marland Kitchen... Take 1 tablet by mouth once a day        Impression & Recommendations:  Problem # 1:  ANXIETY (ICD-300.00) Assessment Comment Only Spoke with Danielle Rojas today. Patient expressing some behaviors that concerned her. She is having some ques. hallucinations. Also speaks in different voice at times and will turn on appliances at home and ask her spouse why he turned them on.  She is concerned about dissociative behaviors. She is referring the patient to psychiatry.  Her updated medication list for this problem includes:    Zoloft 50 Mg Tabs (Sertraline hcl) .Marland Kitchen... Take 1 tablet by mouth once a day  Complete Medication List: 1)  Proventil Hfa 108 (90 Base) Mcg/act Aers (Albuterol sulfate) .Marland Kitchen.. 1-2 puffs every 4-6 hours as needed 2)  Loratadine 10 Mg Tabs (Loratadine) .Marland Kitchen.. 1 tablet by mouth daily for allergies 3)  Simvastatin 40 Mg Tabs (Simvastatin) .... Take 1 tab by mouth at bedtime  (dr. Sharyn Lull) 4)  Metoprolol Succinate 100 Mg Xr24h-tab (Metoprolol succinate) .... Take 1 tablet by mouth once a day (dr. Sharyn Lull) 5)  Omeprazole 20 Mg Cpdr (Omeprazole) .... Take 1 tablet by mouth once a day (dr. Sharyn Lull) 6)  Zoloft 50 Mg Tabs (Sertraline hcl) .... Take 1 tablet by mouth once a day

## 2010-08-30 NOTE — Progress Notes (Signed)
Summary: needs appt  Phone Note Outgoing Call   Summary of Call: Danielle Rojas needs f/u appt for BP and anxiety.  Initial call taken by: Brynda Rim,  March 21, 2010 1:57 PM  Follow-up for Phone Call        pt has appt Follow-up by: Armenia Shannon,  March 21, 2010 2:54 PM

## 2010-08-30 NOTE — Letter (Signed)
Summary: MAILED REQUEST ED RECORDS TO [facility]  MAILED REQUEST ED RECORDS TO [facility]   Imported By: Arta Bruce 05/16/2010 12:33:14  _____________________________________________________________________  External Attachment:    Type:   Image     Comment:   External Document

## 2010-09-30 ENCOUNTER — Encounter (INDEPENDENT_AMBULATORY_CARE_PROVIDER_SITE_OTHER): Payer: Self-pay | Admitting: Nurse Practitioner

## 2010-09-30 ENCOUNTER — Encounter: Payer: Self-pay | Admitting: Nurse Practitioner

## 2010-09-30 DIAGNOSIS — L851 Acquired keratosis [keratoderma] palmaris et plantaris: Secondary | ICD-10-CM | POA: Insufficient documentation

## 2010-09-30 DIAGNOSIS — R5381 Other malaise: Secondary | ICD-10-CM | POA: Insufficient documentation

## 2010-09-30 DIAGNOSIS — R5383 Other fatigue: Secondary | ICD-10-CM

## 2010-09-30 LAB — CONVERTED CEMR LAB
HDL goal, serum: 40 mg/dL
LDL Goal: 160 mg/dL

## 2010-10-06 NOTE — Assessment & Plan Note (Signed)
Summary: HTN   Vital Signs:  Patient profile:   40 year old female Menstrual status:  regular Weight:      323.1 pounds BMI:     52.34 O2 Sat:      99 % on Room air Temp:     97.9 degrees F oral Pulse rate:   101 / minute Pulse rhythm:   regular Resp:     20 per minute BP sitting:   127 / 81  (left arm) Cuff size:   large  Vitals Entered By: Levon Hedger (September 30, 2010 2:29 PM)  Nutrition Counseling: Patient's BMI is greater than 25 and therefore counseled on weight management options.  O2 Flow:  Room air  Serial Vital Signs/Assessments:  Comments: P/F  400,  370,  400 By: Levon Hedger   CC: follow-up visit, Hypertension Management, Lipid Management, Abdominal Pain Is Patient Diabetic? No Pain Assessment Patient in pain? no       Does patient need assistance? Functional Status Self care Ambulation Normal   Primary Care Provider:  Tereso Newcomer PA-C  CC:  follow-up visit, Hypertension Management, Lipid Management, and Abdominal Pain.  History of Present Illness:  Pt presents today for routine f/u  She has all her medications  present with her today  Asthma History    Initial Asthma Severity Rating:    Age range: 12+ years    Symptoms: 0-2 days/week    Nighttime Awakenings: 0-2/month    Interferes w/ normal activity: no limitations    SABA use (not for EIB): 0-2 days/week    Exacerbations requiring oral systemic steroids: 0-1/year    Asthma Severity Assessment: Intermittent  Dyspepsia History:      She has no alarm features of dyspepsia including no history of melena, hematochezia, dysphagia, persistent vomiting, or involuntary weight loss > 5%.  There is a prior history of GERD.  The patient does not have a prior history of documented ulcer disease.  The dominant symptom is not heartburn or acid reflux.  An H-2 blocker medication is not currently being taken.    Hypertension History:      She denies headache, chest pain, and palpitations.  She  notes no problems with any antihypertensive medication side effects.        Positive major cardiovascular risk factors include hyperlipidemia and hypertension.  Negative major cardiovascular risk factors include female age less than 57 years old, no history of diabetes, negative family history for ischemic heart disease, and non-tobacco-user status.        Further assessment for target organ damage reveals no history of ASHD, cardiac end-organ damage (CHF/LVH), stroke/TIA, peripheral vascular disease, renal insufficiency, or hypertensive retinopathy.    Lipid Management History:      Positive NCEP/ATP III risk factors include hypertension.  Negative NCEP/ATP III risk factors include female age less than 47 years old, non-diabetic, no family history for ischemic heart disease, non-tobacco-user status, no ASHD (atherosclerotic heart disease), no prior stroke/TIA, and no peripheral vascular disease.        The patient states that she does not know about the "Therapeutic Lifestyle Change" diet.  Her compliance with the TLC diet is fair.  The patient does not know about adjunctive measures for cholesterol lowering.  She expresses no side effects from her lipid-lowering medication.  Comments include: pt is taking meds as ordered.  The patient denies any symptoms to suggest myopathy or liver disease.         Habits & Providers  Alcohol-Tobacco-Diet  Alcohol drinks/day: 0     Tobacco Status: quit     Cigarette Packs/Day: <0.25     Year Started: 1998     Year Quit: 2010     Passive Smoke Exposure: no  Exercise-Depression-Behavior     Does Patient Exercise: no     Times/week: 7     Have you felt down or hopeless? no     Have you felt little pleasure in things? no     Depression Counseling: not indicated; screening negative for depression     Drug Use: no     Seat Belt Use: 100     Sun Exposure: frequently  Comments: no meaningful exercise at this time  Medications Prior to Update: 1)   Proventil Hfa 108 (90 Base) Mcg/act Aers (Albuterol Sulfate) .Marland Kitchen.. 1-2 Puffs Every 4-6 Hours As Needed 2)  Loratadine 10 Mg Tabs (Loratadine) .Marland Kitchen.. 1 Tablet By Mouth Daily For Allergies 3)  Simvastatin 40 Mg Tabs (Simvastatin) .... Take 1 Tab By Mouth At Bedtime (Dr. Sharyn Lull) 4)  Metoprolol Succinate 100 Mg Xr24h-Tab (Metoprolol Succinate) .... Take 1 Tablet By Mouth Once A Day (Dr. Sharyn Lull) 5)  Omeprazole 20 Mg Cpdr (Omeprazole) .... Take 1 Tablet By Mouth Once A Day (Dr. Sharyn Lull) 6)  Zoloft 100 Mg Tabs (Sertraline Hcl) .... Take 1 and 1/2 Tablets Once Daily 7)  Prinivil 10 Mg Tabs (Lisinopril) .Marland Kitchen.. 1 Tablet By Mouth Twice Daily 8)  Hydroxyzine Hcl 25 Mg Tabs (Hydroxyzine Hcl) .... Take 1 Tablet By Mouth Once A Day As Needed For Anxiety  Current Medications (verified): 1)  Proventil Hfa 108 (90 Base) Mcg/act Aers (Albuterol Sulfate) .Marland Kitchen.. 1-2 Puffs Every 4-6 Hours As Needed 2)  Loratadine 10 Mg Tabs (Loratadine) .Marland Kitchen.. 1 Tablet By Mouth Daily For Allergies 3)  Simvastatin 40 Mg Tabs (Simvastatin) .... Take 1 Tab By Mouth At Bedtime (Dr. Sharyn Lull) 4)  Metoprolol Succinate 100 Mg Xr24h-Tab (Metoprolol Succinate) .... Take 1 Tablet By Mouth Once A Day (Dr. Sharyn Lull) 5)  Omeprazole 20 Mg Cpdr (Omeprazole) .... Take 1 Tablet By Mouth Once A Day (Dr. Sharyn Lull) 6)  Zoloft 100 Mg Tabs (Sertraline Hcl) .... Take 1 and 1/2 Tablets Once Daily 7)  Prinivil 10 Mg Tabs (Lisinopril) .Marland Kitchen.. 1 Tablet By Mouth Twice Daily 8)  Hydroxyzine Hcl 25 Mg Tabs (Hydroxyzine Hcl) .... Take 1 Tablet By Mouth Once A Day As Needed For Anxiety  Allergies (verified): No Known Drug Allergies  Social History: Smoking Status:  quit Packs/Day:  <0.25 Does Patient Exercise:  no  Review of Systems General:  Complains of fatigue; +snoring. CV:  Complains of fatigue. Resp:  Denies cough. GI:  Denies abdominal pain, nausea, and vomiting. Derm:  Complains of dryness and itching. Neuro:  Complains of headaches.  Physical  Exam  General:  alert.  obesity Head:  normocephalic.   Lungs:  normal breath sounds.   Heart:  normal rate and regular rhythm.   Abdomen:  normal bowel sounds.   Msk:  up to the exam Neurologic:  alert & oriented X3.   Skin:  color normal.   Psych:  Oriented X3.     Impression & Recommendations:  Problem # 1:  HYPERTENSION (ICD-401.9) BP is doing well. DASH diet advised pt to continue taking meds Her updated medication list for this problem includes:    Metoprolol Succinate 100 Mg Xr24h-tab (Metoprolol succinate) .Marland Kitchen... Take 1 tablet by mouth once a day (dr. Sharyn Lull)    Prinivil 10 Mg Tabs (Lisinopril) .Marland KitchenMarland KitchenMarland KitchenMarland Kitchen 1  tablet by mouth twice daily  Problem # 2:  ASTHMA (ICD-493.90) Doing much better especially since she has quit smoking Her updated medication list for this problem includes:    Proventil Hfa 108 (90 Base) Mcg/act Aers (Albuterol sulfate) .Marland Kitchen... 1-2 puffs every 4-6 hours as needed  Orders: Peak Flow Rate (94150) Pulse Oximetry (single measurment) (94760)  Problem # 3:  OBESITY (ICD-278.00) pt advised to increarse her exercise  Problem # 4:  FATIGUE (ICD-780.79) will check labs on next visit ? may need to rule out sleep apnea  Problem # 5:  HYPERLIPIDEMIA (ICD-272.4) need to check labs on next visit Her updated medication list for this problem includes:    Simvastatin 40 Mg Tabs (Simvastatin) .Marland Kitchen... Take 1 tab by mouth at bedtime (dr. Sharyn Lull)  Complete Medication List: 1)  Proventil Hfa 108 (90 Base) Mcg/act Aers (Albuterol sulfate) .Marland Kitchen.. 1-2 puffs every 4-6 hours as needed 2)  Simvastatin 40 Mg Tabs (Simvastatin) .... Take 1 tab by mouth at bedtime (dr. Sharyn Lull) 3)  Metoprolol Succinate 100 Mg Xr24h-tab (Metoprolol succinate) .... Take 1 tablet by mouth once a day (dr. Sharyn Lull) 4)  Omeprazole 20 Mg Cpdr (Omeprazole) .... Take 1 tablet by mouth once a day (dr. Sharyn Lull) 5)  Zoloft 100 Mg Tabs (Sertraline hcl) .... Take 1 and 1/2 tablets once daily 6)  Prinivil 10 Mg  Tabs (Lisinopril) .Marland Kitchen.. 1 tablet by mouth twice daily 7)  Hydroxyzine Hcl 25 Mg Tabs (Hydroxyzine hcl) .... Take 1 tablet by mouth once a day as needed for anxiety  Asthma Management Plan    Asthma Severity: Intermittent    Control Assessment: Well Controlled    Personal best PEF: 400 liters/minute    Predicted PEF: 487 liters/minute    Working PEF: 400 liters/minute    Plan based on PEF formula: [employer] [employer] Zone: (Range: 320 to 400)  Yellow Zone: PROVENTIL HFA 108 (90 BASE) MCG/ACT AERS:  2 puffs every 4 hours as needed  Red Zone: PROVENTIL HFA 108 (90 BASE) MCG/ACT AERS Call your physician for shortness of breath.    Hypertension Assessment/Plan:      The patient's hypertensive risk group is category B: At least one risk factor (excluding diabetes) with no target organ damage.  Her calculated 10 year risk of coronary heart disease is 2 %.  Today's blood pressure is 127/81.  Her blood pressure goal is < 140/90.  Lipid Assessment/Plan:      Based on NCEP/ATP III, the patient's risk factor category is "0-1 risk factors".  The patient's lipid goals are as follows: Total cholesterol goal is 200; LDL cholesterol goal is 160; HDL cholesterol goal is 40; Triglyceride goal is 150.    Patient Instructions: 1)  You need to start exercising.  Start by waking 10-15 minutes per day. 2)  You need will schedule a fasting lab visit - will need lipids (272.4), cbc (780.79) , vitamin d (780.79). 3)  No food after midnight before this visit 4)  Follow up with provider in 4 months for asthma and high blood pressure.   Orders Added: 1)  Est. Patient Level IV [16109] 2)  Peak Flow Rate [94150] 3)  Pulse Oximetry (single measurment) [60454]

## 2010-10-14 ENCOUNTER — Encounter (INDEPENDENT_AMBULATORY_CARE_PROVIDER_SITE_OTHER): Payer: Self-pay | Admitting: Internal Medicine

## 2010-10-14 LAB — CONVERTED CEMR LAB
ALT: 11 units/L (ref 0–35)
Bilirubin, Direct: 0.1 mg/dL (ref 0.0–0.3)
CO2: 25 meq/L (ref 19–32)
Chloride: 106 meq/L (ref 96–112)
Glucose, Bld: 98 mg/dL (ref 70–99)
Lymphocytes Relative: 42 % (ref 12–46)
Lymphs Abs: 2.5 10*3/uL (ref 0.7–4.0)
Neutrophils Relative %: 44 % (ref 43–77)
Platelets: 344 10*3/uL (ref 150–400)
Potassium: 4.8 meq/L (ref 3.5–5.3)
Sodium: 141 meq/L (ref 135–145)
Total Bilirubin: 0.6 mg/dL (ref 0.3–1.2)
Total CHOL/HDL Ratio: 4.1
VLDL: 21 mg/dL (ref 0–40)
Vit D, 25-Hydroxy: 17 ng/mL — ABNORMAL LOW (ref 30–89)
WBC: 5.8 10*3/uL (ref 4.0–10.5)

## 2010-10-16 LAB — URINALYSIS, ROUTINE W REFLEX MICROSCOPIC
Nitrite: NEGATIVE
Specific Gravity, Urine: 1.023 (ref 1.005–1.030)
Urobilinogen, UA: 2 mg/dL — ABNORMAL HIGH (ref 0.0–1.0)

## 2010-10-16 LAB — RAPID STREP SCREEN (MED CTR MEBANE ONLY): Streptococcus, Group A Screen (Direct): POSITIVE — AB

## 2010-10-16 LAB — POCT I-STAT, CHEM 8
Calcium, Ion: 1.08 mmol/L — ABNORMAL LOW (ref 1.12–1.32)
Chloride: 104 mEq/L (ref 96–112)
HCT: 39 % (ref 36.0–46.0)
Sodium: 136 mEq/L (ref 135–145)
TCO2: 23 mmol/L (ref 0–100)

## 2010-10-16 LAB — DIFFERENTIAL
Basophils Absolute: 0 10*3/uL (ref 0.0–0.1)
Basophils Relative: 0 % (ref 0–1)
Eosinophils Relative: 0 % (ref 0–5)
Monocytes Absolute: 0.6 10*3/uL (ref 0.1–1.0)
Neutro Abs: 10 10*3/uL — ABNORMAL HIGH (ref 1.7–7.7)

## 2010-10-16 LAB — CBC
HCT: 37.3 % (ref 36.0–46.0)
MCHC: 34.5 g/dL (ref 30.0–36.0)
Platelets: 312 10*3/uL (ref 150–400)
RDW: 13.1 % (ref 11.5–15.5)

## 2010-10-16 LAB — URINE MICROSCOPIC-ADD ON

## 2011-05-03 LAB — CBC
MCV: 97.3
RBC: 4.59
WBC: 9.6

## 2011-05-03 LAB — D-DIMER, QUANTITATIVE: D-Dimer, Quant: 0.45

## 2011-05-03 LAB — BASIC METABOLIC PANEL
Chloride: 108
Creatinine, Ser: 0.88
GFR calc Af Amer: >60

## 2011-09-01 ENCOUNTER — Other Ambulatory Visit: Payer: Self-pay | Admitting: Cardiology

## 2012-02-02 ENCOUNTER — Other Ambulatory Visit: Payer: Self-pay | Admitting: Internal Medicine

## 2012-02-02 DIAGNOSIS — Z1231 Encounter for screening mammogram for malignant neoplasm of breast: Secondary | ICD-10-CM

## 2012-02-15 ENCOUNTER — Ambulatory Visit
Admission: RE | Admit: 2012-02-15 | Discharge: 2012-02-15 | Disposition: A | Discharge status: Home / Self Care | Payer: Medicaid Other | Source: Ambulatory Visit | Attending: Internal Medicine | Admitting: Internal Medicine

## 2012-02-15 DIAGNOSIS — Z1231 Encounter for screening mammogram for malignant neoplasm of breast: Secondary | ICD-10-CM

## 2012-02-20 ENCOUNTER — Other Ambulatory Visit: Payer: Self-pay | Admitting: Internal Medicine

## 2012-02-20 DIAGNOSIS — R928 Other abnormal and inconclusive findings on diagnostic imaging of breast: Secondary | ICD-10-CM

## 2012-02-26 ENCOUNTER — Ambulatory Visit
Admission: RE | Admit: 2012-02-26 | Discharge: 2012-02-26 | Disposition: A | Discharge status: Home / Self Care | Payer: Medicaid Other | Source: Ambulatory Visit | Attending: Internal Medicine | Admitting: Internal Medicine

## 2012-02-26 DIAGNOSIS — R928 Other abnormal and inconclusive findings on diagnostic imaging of breast: Secondary | ICD-10-CM

## 2012-04-20 ENCOUNTER — Emergency Department (HOSPITAL_COMMUNITY)
Admission: EM | Admit: 2012-04-20 | Discharge: 2012-04-20 | Disposition: A | Discharge status: Home / Self Care | Payer: Medicaid Other | Source: Home / Self Care | Attending: Emergency Medicine | Admitting: Emergency Medicine

## 2012-04-20 ENCOUNTER — Encounter (HOSPITAL_COMMUNITY): Payer: Self-pay | Admitting: Emergency Medicine

## 2012-04-20 DIAGNOSIS — N3000 Acute cystitis without hematuria: Secondary | ICD-10-CM

## 2012-04-20 HISTORY — DX: Essential (primary) hypertension: I10

## 2012-04-20 LAB — POCT URINALYSIS DIP (DEVICE)
Protein, ur: 30 mg/dL — AB
Specific Gravity, Urine: >=1.03 (ref 1.005–1.030)
Urobilinogen, UA: 0.2 mg/dL (ref 0.0–1.0)
pH: 5.5 (ref 5.0–8.0)

## 2012-04-20 MED ORDER — PHENAZOPYRIDINE HCL 200 MG PO TABS: 200.0000 mg | ORAL_TABLET | Freq: Three times a day (TID) | ORAL | Status: DC | PRN

## 2012-04-20 MED ORDER — CEPHALEXIN 500 MG PO CAPS: 500.0000 mg | ORAL_CAPSULE | Freq: Three times a day (TID) | ORAL | Status: DC

## 2012-04-20 NOTE — ED Provider Notes (Signed)
Chief Complaint  Patient presents with  . Dysuria    burning with urinating    History of Present Illness:   The patient is a 41 year old female with a 2 to three-day history of dysuria, burning, frequency, and urgency. She denies any hematuria. She's had no vaginal discharge, itching, pelvic pain, or lower back pain. She denies any fever, chills, nausea, or vomiting. She had a urinary tract infection years ago.  Review of Systems:  Other than noted above, the patient denies any of the following symptoms: General:  No fevers, chills, sweats, aches, or fatigue. GI:  No abdominal pain, back pain, nausea, vomiting, diarrhea, or constipation. GU:  No dysuria, frequency, urgency, hematuria, or incontinence. GYN:  No discharge, itching, vulvar pain or lesions, pelvic pain, or abnormal vaginal bleeding.  PMFSH:  Past medical history, family history, social history, meds, and allergies were reviewed.  Physical Exam:   Vital signs:  BP 124/83  Pulse 101  Temp 98.7 F (37.1 C) (Oral)  Resp 18  SpO2 97%  LMP 04/15/2012 Gen:  Alert, oriented, in no distress. Lungs:  Clear to auscultation, no wheezes, rales or rhonchi. Heart:  Regular rhythm, no gallop or murmer. Abdomen:  Flat and soft. There was slight suprapubic pain to palpation.  No guarding, or rebound.  No hepato-splenomegaly or mass.  Bowel sounds were normally active.  No hernia. Back:  No CVA tenderness.  Skin:  Clear, warm and dry.  Labs:   Results for orders placed during the hospital encounter of 04/20/12  POCT URINALYSIS DIP (DEVICE)      Component Value Range   Glucose, UA NEGATIVE  NEGATIVE mg/dL   Bilirubin Urine NEGATIVE  NEGATIVE   Ketones, ur NEGATIVE  NEGATIVE mg/dL   Specific Gravity, Urine >=1.030  1.005 - 1.030   Hgb urine dipstick LARGE (*) NEGATIVE   pH 5.5  5.0 - 8.0   Protein, ur 30 (*) NEGATIVE mg/dL   Urobilinogen, UA 0.2  0.0 - 1.0 mg/dL   Nitrite NEGATIVE  NEGATIVE   Leukocytes, UA MODERATE (*) NEGATIVE    POCT PREGNANCY, URINE      Component Value Range   Preg Test, Ur NEGATIVE  NEGATIVE     Other Labs Obtained at Urgent Care Center:  A urine culture was obtained.  Results are pending at this time and we will call about any positive results.  Assessment: The encounter diagnosis was Acute cystitis.   Plan:   1.  The following meds were prescribed:   New Prescriptions   CEPHALEXIN (KEFLEX) 500 MG CAPSULE    Take 1 capsule (500 mg total) by mouth 3 (three) times daily.   PHENAZOPYRIDINE (PYRIDIUM) 200 MG TABLET    Take 1 tablet (200 mg total) by mouth 3 (three) times daily as needed for pain.   2.  The patient was instructed in symptomatic care and handouts were given. 3.  The patient was told to return if becoming worse in any way, if no better in 3 or 4 days, and given some red flag symptoms that would indicate earlier return. 4.  The patient was told to avoid intercourse for 10 days, get extra fluids, and return for a follow up with her primary care doctor at the completion of treatment for a repeat UA and culture.     Reuben Likes, MD 04/20/12 2031

## 2012-04-20 NOTE — ED Notes (Signed)
Pt c/o burning with urinating x 2 days. Pt denies any abnormal discharge or pelvic pain.

## 2012-04-24 LAB — URINE CULTURE: Colony Count: >=100000

## 2012-04-25 NOTE — ED Notes (Signed)
Urine culture shows PROTEUS MIRABILIS.  Pt adequately treated at visit with Keflex per Dr. Lorenz Coaster.

## 2012-08-19 ENCOUNTER — Other Ambulatory Visit: Payer: Self-pay | Admitting: Internal Medicine

## 2012-08-19 DIAGNOSIS — N920 Excessive and frequent menstruation with regular cycle: Secondary | ICD-10-CM

## 2012-08-22 ENCOUNTER — Ambulatory Visit
Admission: RE | Admit: 2012-08-22 | Discharge: 2012-08-22 | Disposition: A | Discharge status: Home / Self Care | Payer: Medicaid Other | Source: Ambulatory Visit | Attending: Internal Medicine | Admitting: Internal Medicine

## 2012-08-22 DIAGNOSIS — N920 Excessive and frequent menstruation with regular cycle: Secondary | ICD-10-CM

## 2012-09-14 ENCOUNTER — Other Ambulatory Visit: Payer: Self-pay

## 2013-04-10 ENCOUNTER — Other Ambulatory Visit: Payer: Self-pay

## 2013-04-10 DIAGNOSIS — Z1231 Encounter for screening mammogram for malignant neoplasm of breast: Secondary | ICD-10-CM

## 2013-04-21 ENCOUNTER — Ambulatory Visit: Payer: Medicaid Other

## 2013-06-05 ENCOUNTER — Other Ambulatory Visit: Payer: Self-pay

## 2013-07-02 ENCOUNTER — Ambulatory Visit
Admission: RE | Admit: 2013-07-02 | Discharge: 2013-07-02 | Disposition: A | Discharge status: Home / Self Care | Payer: Medicaid Other | Source: Ambulatory Visit

## 2013-07-02 DIAGNOSIS — Z1231 Encounter for screening mammogram for malignant neoplasm of breast: Secondary | ICD-10-CM

## 2013-07-22 IMAGING — US US PELVIS COMPLETE
1 series · 14 of 25 positions shown · non-contrast
Comparison: None

CLINICAL DATA: Excessive and frequent menstruation

TRANSABDOMINAL AND TRANSVAGINAL ULTRASOUND OF PELVIS
TECHNIQUE: Both transabdominal and transvaginal ultrasound
examinations of the pelvis were performed including evaluation of
the uterus, ovaries, adnexal regions, and pelvic cul-de-sac.

[Series 1: us pelvis complete · 0.27mm/px · 14 of 57 slices shown]
[im 1/57]
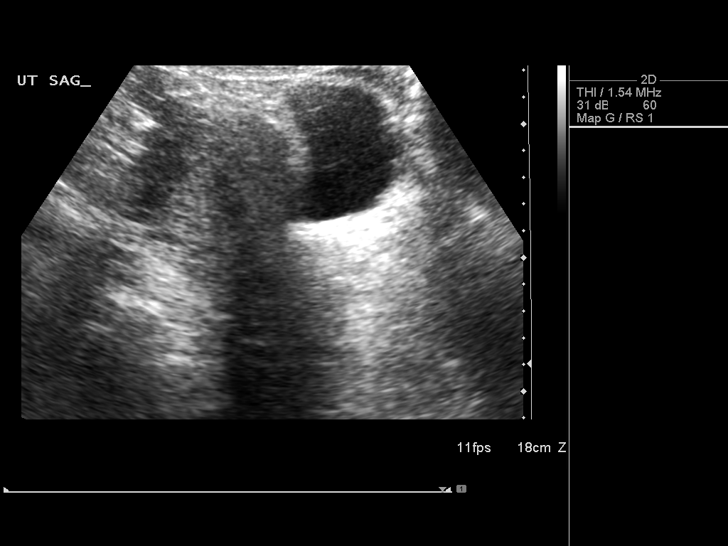
[im 5/57]
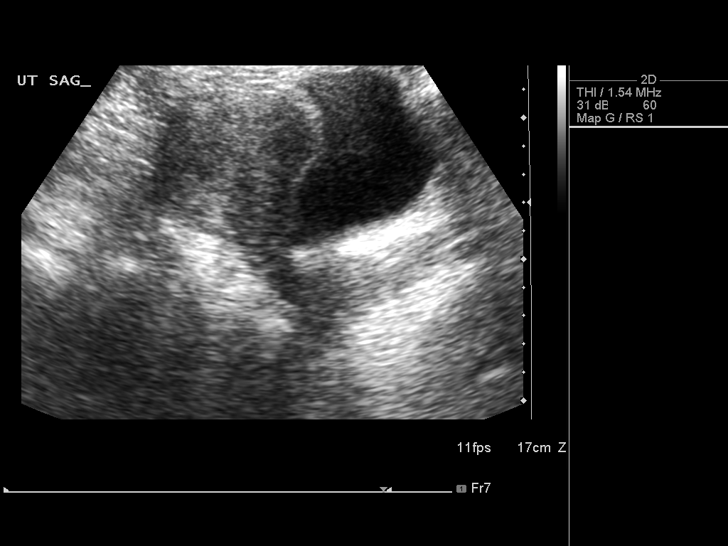
[im 10/57]
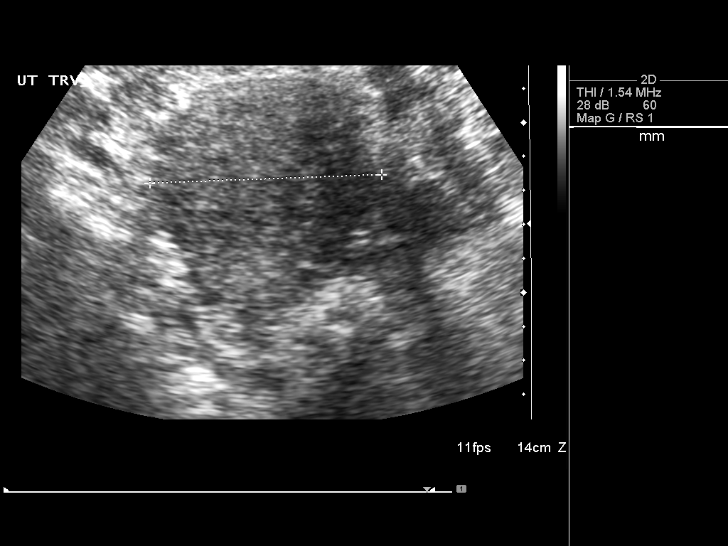
[im 15/57]
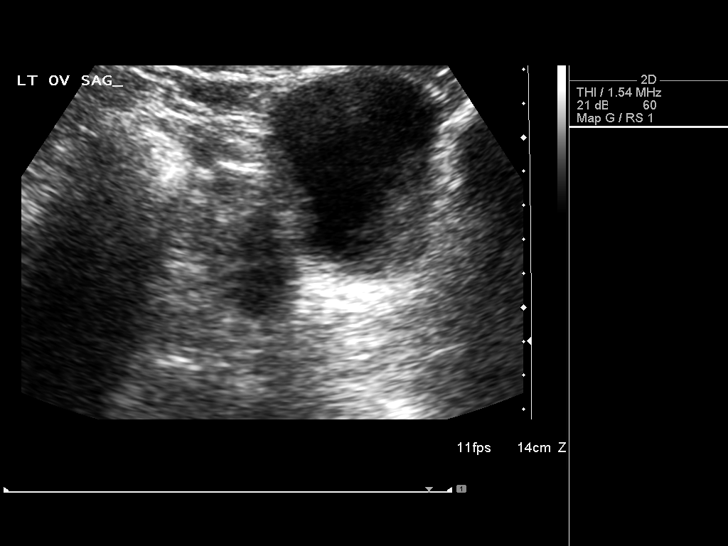
[im 19/57]
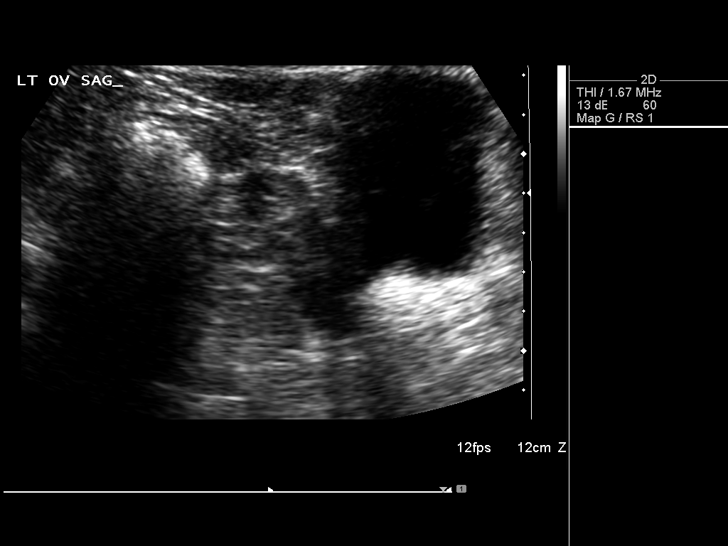
[im 22/57]
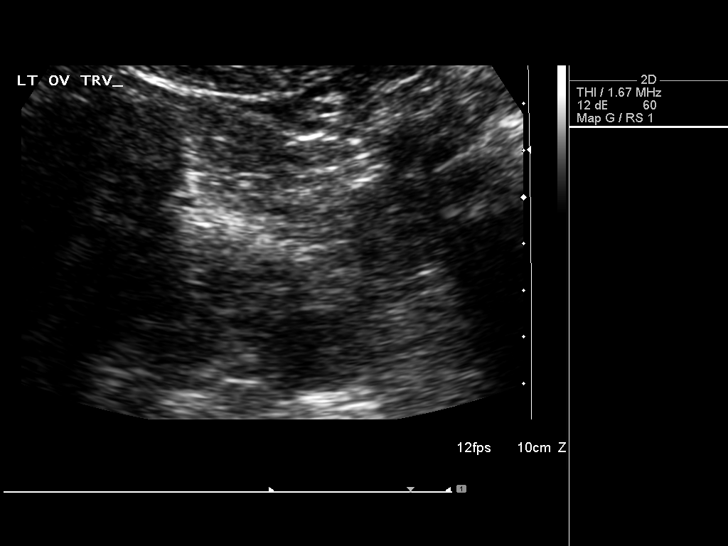
[im 26/57]
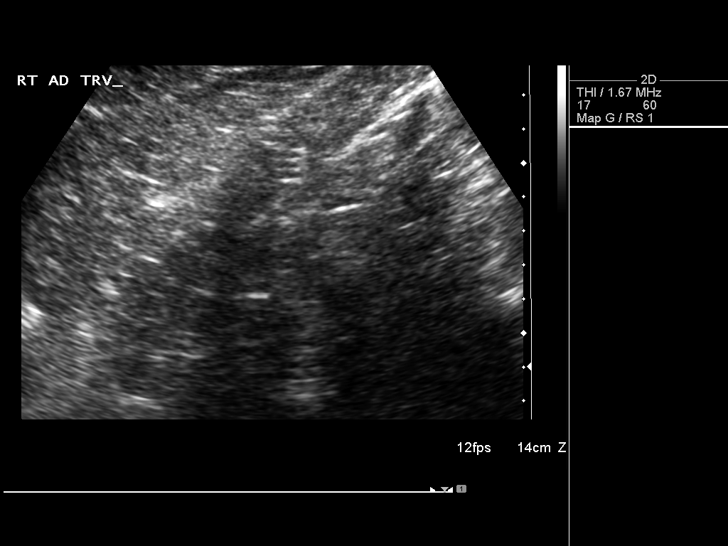
[im 31/57]
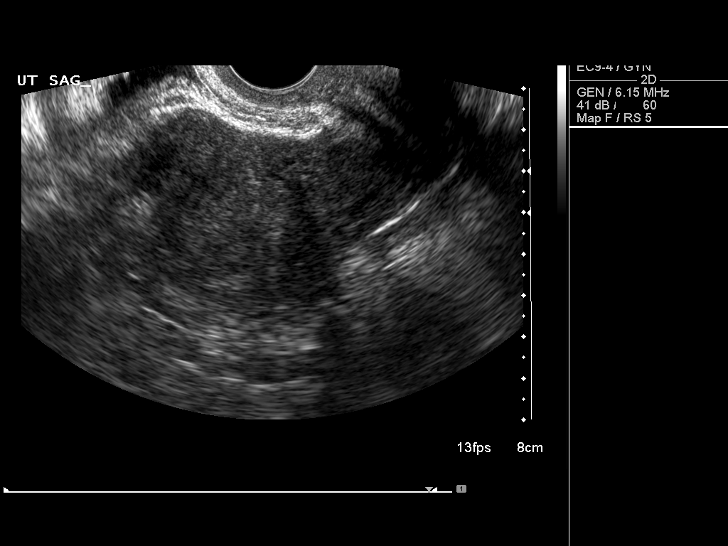
[im 36/57]
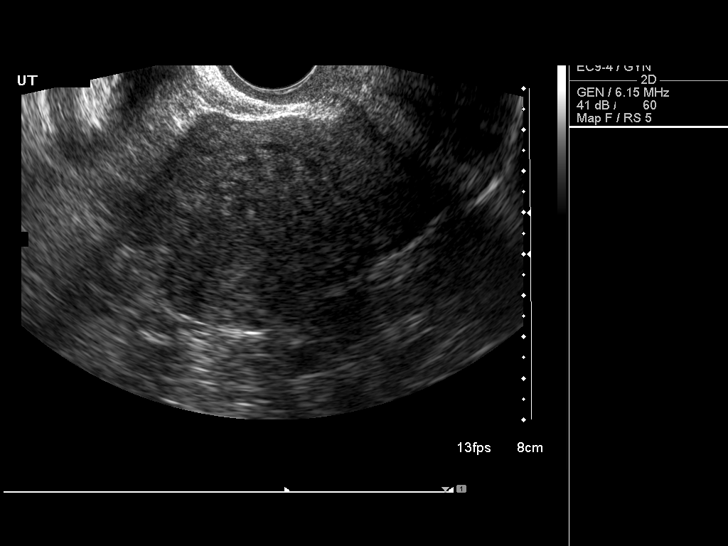
[im 38/57]
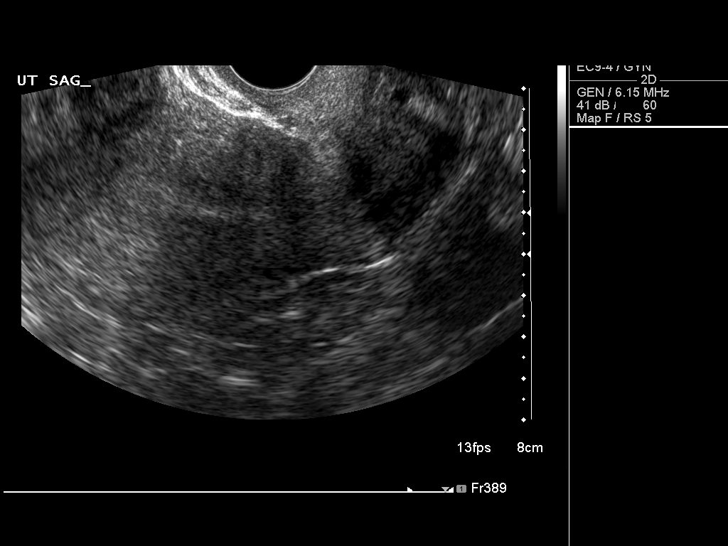
[im 43/57]
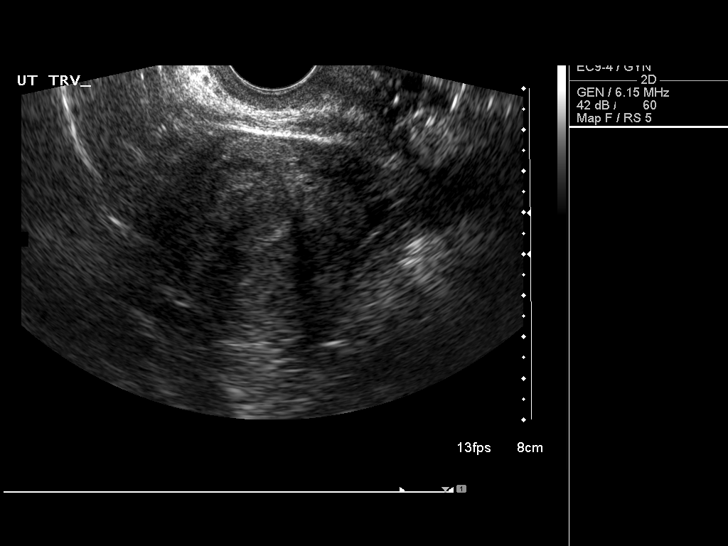
[im 47/57]
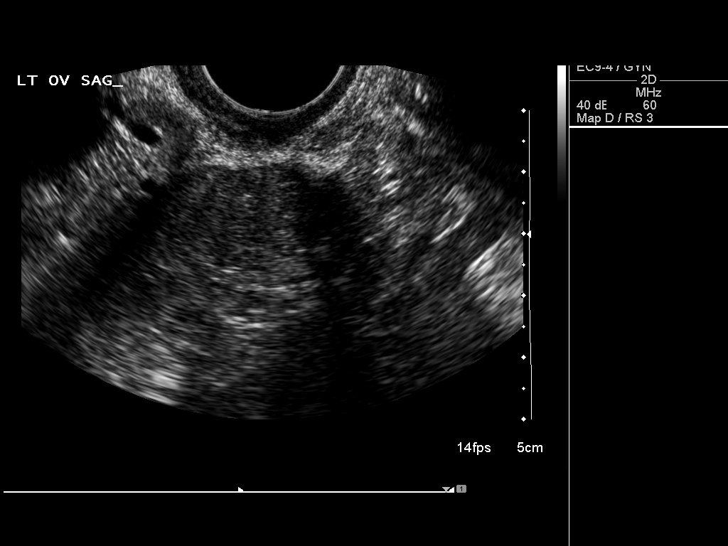
[im 52/57]
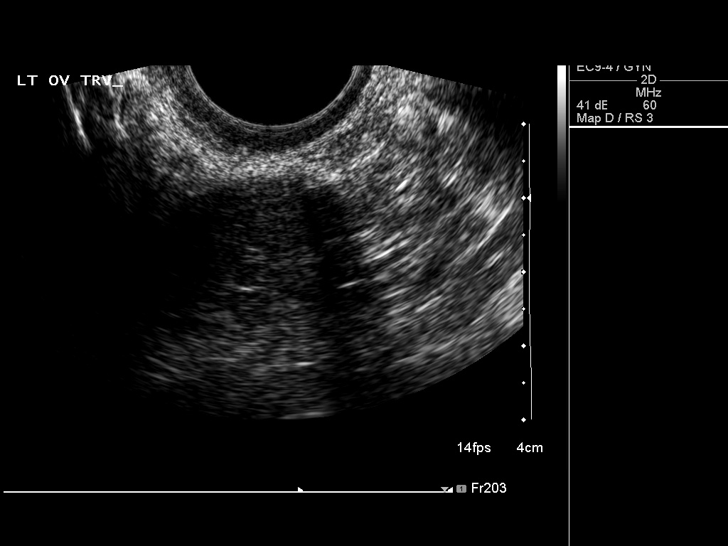
[im 57/57]
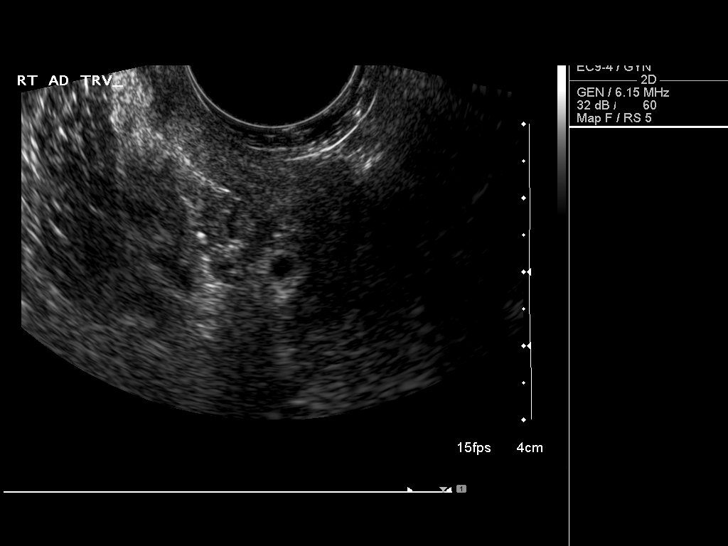

[14 of 25 positions shown; findings below may reference images not displayed]

FINDINGS: Uterus: The uterus measures 12.0 cm sagittally with a depth of
cm and width of 6.8 cm. There is a single fibroid along the
posterior uterine fundus of 2.3 x 2.1 x 2.4 cm.  This fibroid does
not appear to extend submucosal.

Endometrium:The endometrium is normal measuring 9.2 mm in
thickness.

Right Ovary :The right ovary is not visualized.

Left Ovary :The left ovary is unremarkable measuring 3.2 x 1.9 x
1.4 cm.

Other Findings:  No free fluid is seen.
IMPRESSION: 1.  Single posterior fundal uterine fibroid of 2.3 x 2.1 x 2.4 cm.
2.  The left ovary is normal with the right ovary not well
visualized.

## 2014-11-03 ENCOUNTER — Other Ambulatory Visit: Payer: Self-pay

## 2014-11-03 DIAGNOSIS — Z1231 Encounter for screening mammogram for malignant neoplasm of breast: Secondary | ICD-10-CM

## 2014-11-06 ENCOUNTER — Ambulatory Visit
Admission: RE | Admit: 2014-11-06 | Discharge: 2014-11-06 | Disposition: A | Discharge status: Home / Self Care | Payer: Medicaid Other | Source: Ambulatory Visit

## 2014-11-06 DIAGNOSIS — Z1231 Encounter for screening mammogram for malignant neoplasm of breast: Secondary | ICD-10-CM

## 2014-12-06 ENCOUNTER — Emergency Department (INDEPENDENT_AMBULATORY_CARE_PROVIDER_SITE_OTHER): Payer: Medicaid Other

## 2014-12-06 ENCOUNTER — Encounter (HOSPITAL_COMMUNITY): Payer: Self-pay | Admitting: *Deleted

## 2014-12-06 ENCOUNTER — Emergency Department (HOSPITAL_COMMUNITY)
Admission: EM | Admit: 2014-12-06 | Discharge: 2014-12-06 | Disposition: A | Discharge status: Home / Self Care | Payer: Medicaid Other | Source: Home / Self Care | Attending: Family Medicine | Admitting: Family Medicine

## 2014-12-06 DIAGNOSIS — S60221A Contusion of right hand, initial encounter: Secondary | ICD-10-CM

## 2014-12-06 HISTORY — DX: Major depressive disorder, single episode, unspecified: F32.9

## 2014-12-06 HISTORY — DX: Depression, unspecified: F32.A

## 2014-12-06 HISTORY — DX: Hyperlipidemia, unspecified: E78.5

## 2014-12-06 HISTORY — DX: Obesity, unspecified: E66.9

## 2014-12-06 HISTORY — DX: Gastro-esophageal reflux disease without esophagitis: K21.9

## 2014-12-06 NOTE — Discharge Instructions (Signed)
Soak in warm water and advil as needed for soreness

## 2014-12-06 NOTE — ED Notes (Signed)
Reports accidentally hitting back of hand on some shoes while playing with son 8 days ago.  Continues with right knuckle pain and swelling.

## 2014-12-06 NOTE — ED Provider Notes (Signed)
CSN: [id number]     Arrival date & time 12/06/14  1112 History   First MD Initiated Contact with Patient 12/06/14 1135     Chief Complaint  Patient presents with  . Hand Injury   (Consider location/radiation/quality/duration/timing/severity/associated sxs/prior Treatment) Patient is a 44 y.o. female presenting with hand injury. The history is provided by the patient.  Hand Injury Location:  Hand Time since incident:  1 week Injury: yes   Mechanism of injury comment:  Struck dorsum of hand  rmf pip joint area against shoe while playing with son. Hand location:  Dorsum of R hand Pain details:    Quality:  Sharp Chronicity:  New Dislocation: no   Foreign body present:  No foreign bodies Prior injury to area:  No Relieved by:  None tried Associated symptoms: decreased range of motion, stiffness and swelling   Associated symptoms: no fever     Past Medical History  Diagnosis Date  . Hypertension   . Depression   . Obesity   . GERD (gastroesophageal reflux disease)   . Hyperlipidemia    Past Surgical History  Procedure Laterality Date  . Gall stone removal     No family history on file. History  Substance Use Topics  . Smoking status: Never Smoker   . Smokeless tobacco: Not on file  . Alcohol Use: No   OB History    No data available     Review of Systems  Constitutional: Negative.  Negative for fever.  Musculoskeletal: Positive for joint swelling and stiffness.  Skin: Negative.     Allergies  Review of patient's allergies indicates no known allergies.  Home Medications   Prior to Admission medications   Medication Sig Start Date End Date Taking? Authorizing Provider  Esomeprazole Magnesium (NEXIUM PO) Take by mouth.   Yes Historical Provider, MD  LISINOPRIL PO Take by mouth.   Yes Historical Provider, MD  METOPROLOL TARTRATE PO Take by mouth.   Yes Historical Provider, MD  Sertraline HCl (ZOLOFT PO) Take by mouth.   Yes Historical Provider, MD  SIMVASTATIN  PO Take by mouth.   Yes Historical Provider, MD  cephALEXin (KEFLEX) 500 MG capsule Take 1 capsule (500 mg total) by mouth 3 (three) times daily. 04/20/12   Reuben Likesavid C Keller, MD  phenazopyridine (PYRIDIUM) 200 MG tablet Take 1 tablet (200 mg total) by mouth 3 (three) times daily as needed for pain. 04/20/12   Reuben Likesavid C Keller, MD   BP 114/79 mmHg  Pulse 103  Temp(Src) 98.6 F (37 C) (Oral)  Resp 18  SpO2 100%  LMP 11/22/2014 (Exact Date) Physical Exam  Constitutional: She is oriented to person, place, and time. She appears well-developed and well-nourished.  Musculoskeletal: She exhibits tenderness.       Right hand: She exhibits decreased range of motion and tenderness. Normal sensation noted. Normal strength noted.       Hands: Neurological: She is alert and oriented to person, place, and time.  Skin: Skin is warm.  Nursing note and vitals reviewed.   ED Course  Procedures (including critical care time) Labs Review Labs Reviewed - No data to display  Imaging Review Dg Hand Complete Right  12/06/2014   CLINICAL DATA:  Pt hit hand 8 days ago causing right middle finger to hyper extend. She complains of pain 3rd MCP jt of right hand  EXAM: RIGHT HAND - COMPLETE 3+ VIEW  COMPARISON:  None.  FINDINGS: There is no evidence of fracture or dislocation. There is  no evidence of arthropathy or other focal bone abnormality. Soft tissues are unremarkable.  IMPRESSION: Negative.   Electronically Signed   By: Amie Portlandavid  Ormond M.D.   On: 12/06/2014 11:57   X-rays reviewed and report per radiologist.   MDM   1. Contusion, hand, right, initial encounter        Linna HoffJames D Obaloluwa Delatte, MD 12/06/14 1216

## 2015-07-06 ENCOUNTER — Emergency Department (HOSPITAL_COMMUNITY)
Admission: EM | Admit: 2015-07-06 | Discharge: 2015-07-06 | Disposition: A | Discharge status: Home / Self Care | Payer: Medicaid Other | Attending: Emergency Medicine | Admitting: Emergency Medicine

## 2015-07-06 ENCOUNTER — Encounter (HOSPITAL_COMMUNITY): Payer: Self-pay | Admitting: Emergency Medicine

## 2015-07-06 DIAGNOSIS — F329 Major depressive disorder, single episode, unspecified: Secondary | ICD-10-CM | POA: Diagnosis not present

## 2015-07-06 DIAGNOSIS — I10 Essential (primary) hypertension: Secondary | ICD-10-CM | POA: Insufficient documentation

## 2015-07-06 DIAGNOSIS — K219 Gastro-esophageal reflux disease without esophagitis: Secondary | ICD-10-CM | POA: Insufficient documentation

## 2015-07-06 DIAGNOSIS — R000 Tachycardia, unspecified: Secondary | ICD-10-CM | POA: Insufficient documentation

## 2015-07-06 DIAGNOSIS — J029 Acute pharyngitis, unspecified: Secondary | ICD-10-CM | POA: Diagnosis present

## 2015-07-06 DIAGNOSIS — Z79899 Other long term (current) drug therapy: Secondary | ICD-10-CM | POA: Diagnosis not present

## 2015-07-06 DIAGNOSIS — J020 Streptococcal pharyngitis: Secondary | ICD-10-CM | POA: Insufficient documentation

## 2015-07-06 DIAGNOSIS — E669 Obesity, unspecified: Secondary | ICD-10-CM | POA: Diagnosis not present

## 2015-07-06 DIAGNOSIS — E785 Hyperlipidemia, unspecified: Secondary | ICD-10-CM | POA: Diagnosis not present

## 2015-07-06 LAB — RAPID STREP SCREEN (MED CTR MEBANE ONLY): Streptococcus, Group A Screen (Direct): POSITIVE — AB

## 2015-07-06 MED ORDER — IBUPROFEN 600 MG PO TABS: 600.0000 mg | ORAL_TABLET | Freq: Four times a day (QID) | ORAL | Status: DC | PRN

## 2015-07-06 MED ORDER — PENICILLIN V POTASSIUM 500 MG PO TABS: 500.0000 mg | ORAL_TABLET | Freq: Three times a day (TID) | ORAL | Status: AC

## 2015-07-06 NOTE — Discharge Instructions (Signed)
Ibuprofen or tylenol for pain. Penicillin as prescribed until all gone for infection. Salt water gargles. Follow up with primary care doctor.   Strep Throat Strep throat is a bacterial infection of the throat. Your health care provider may call the infection tonsillitis or pharyngitis, depending on whether there is swelling in the tonsils or at the back of the throat. Strep throat is most common during the cold months of the year in children who are 775-43 years of age, but it can happen during any season in people of any age. This infection is spread from person to person (contagious) through coughing, sneezing, or close contact. CAUSES Strep throat is caused by the bacteria called Streptococcus pyogenes. RISK FACTORS This condition is more likely to develop in:  People who spend time in crowded places where the infection can spread easily.  People who have close contact with someone who has strep throat. SYMPTOMS Symptoms of this condition include:  Fever or chills.   Redness, swelling, or pain in the tonsils or throat.  Pain or difficulty when swallowing.  White or yellow spots on the tonsils or throat.  Swollen, tender glands in the neck or under the jaw.  Red rash all over the body (rare). DIAGNOSIS This condition is diagnosed by performing a rapid strep test or by taking a swab of your throat (throat culture test). Results from a rapid strep test are usually ready in a few minutes, but throat culture test results are available after one or two days. TREATMENT This condition is treated with antibiotic medicine. HOME CARE INSTRUCTIONS Medicines  Take over-the-counter and prescription medicines only as told by your health care provider.  Take your antibiotic as told by your health care provider. Do not stop taking the antibiotic even if you start to feel better.  Have family members who also have a sore throat or fever tested for strep throat. They may need antibiotics if they  have the strep infection. Eating and Drinking  Do not share food, drinking cups, or personal items that could cause the infection to spread to other people.  If swallowing is difficult, try eating soft foods until your sore throat feels better.  Drink enough fluid to keep your urine clear or pale yellow. General Instructions  Gargle with a salt-water mixture 3-4 times per day or as needed. To make a salt-water mixture, completely dissolve -1 tsp of salt in 1 cup of warm water.  Make sure that all household members wash their hands well.  Get plenty of rest.  Stay home from school or work until you have been taking antibiotics for 24 hours.  Keep all follow-up visits as told by your health care provider. This is important. SEEK MEDICAL CARE IF:  The glands in your neck continue to get bigger.  You develop a rash, cough, or earache.  You cough up a thick liquid that is green, yellow-brown, or bloody.  You have pain or discomfort that does not get better with medicine.  Your problems seem to be getting worse rather than better.  You have a fever. SEEK IMMEDIATE MEDICAL CARE IF:  You have new symptoms, such as vomiting, severe headache, stiff or painful neck, chest pain, or shortness of breath.  You have severe throat pain, drooling, or changes in your voice.  You have swelling of the neck, or the skin on the neck becomes red and tender.  You have signs of dehydration, such as fatigue, dry mouth, and decreased urination.  You become  increasingly sleepy, or you cannot wake up completely.  Your joints become red or painful.   This information is not intended to replace advice given to you by your health care provider. Make sure you discuss any questions you have with your health care provider.   Document Released: 07/14/2000 Document Revised: 04/07/2015 Document Reviewed: 11/09/2014 Elsevier Interactive Patient Education [employer]

## 2015-07-06 NOTE — ED Provider Notes (Signed)
CSN: [id number]     Arrival date & time 07/06/15  1057 History  By signing my name below, I, Ronney LionSuzanne Le, attest that this documentation has been prepared under the direction and in the presence of Treasure Ingrum A Semaj Kham, PA-C. Electronically Signed: Ronney LionSuzanne Le, ED Scribe. 07/06/2015. 11:14 AM.    Chief Complaint  Patient presents with  . Sore Throat   The history is provided by the patient. No language interpreter was used.    HPI Comments: Danielle Dolinanecia Streng is a 44 y.o. female with a history of GERD, HTN, HLD, depression, and obesity, who presents to the Emergency Department complaining of a gradual-onset, constant, 8/10 sore throat that began 3-4 days ago. She notes associated nasal congestion and rhinorrhea. She denies a history of any surgeries. Patient has tried a cold medicine with minimal relief. Nobody around her has been sick.  She denies a history of smoking, EtOH consumption, or illicit drug use. She denies cough and is not aware of any fever. Patient has NKDA.   Past Medical History  Diagnosis Date  . Hypertension   . Depression   . Obesity   . GERD (gastroesophageal reflux disease)   . Hyperlipidemia    Past Surgical History  Procedure Laterality Date  . Gall stone removal     No family history on file. Social History  Substance Use Topics  . Smoking status: Never Smoker   . Smokeless tobacco: Not on file  . Alcohol Use: No   OB History    No data available     Review of Systems  Constitutional: Negative for fever.  HENT: Positive for congestion, rhinorrhea and sore throat.   Respiratory: Negative for cough.     Allergies  Review of patient's allergies indicates no known allergies.  Home Medications   Prior to Admission medications   Medication Sig Start Date End Date Taking? Authorizing Provider  cephALEXin (KEFLEX) 500 MG capsule Take 1 capsule (500 mg total) by mouth 3 (three) times daily. 04/20/12   Reuben Likesavid C Keller, MD  Esomeprazole Magnesium (NEXIUM PO)  Take by mouth.    Historical Provider, MD  LISINOPRIL PO Take by mouth.    Historical Provider, MD  METOPROLOL TARTRATE PO Take by mouth.    Historical Provider, MD  phenazopyridine (PYRIDIUM) 200 MG tablet Take 1 tablet (200 mg total) by mouth 3 (three) times daily as needed for pain. 04/20/12   Reuben Likesavid C Keller, MD  Sertraline HCl (ZOLOFT PO) Take by mouth.    Historical Provider, MD  SIMVASTATIN PO Take by mouth.    Historical Provider, MD   BP 152/86 mmHg  Pulse 105  Temp(Src) 100 F (37.8 C) (Oral)  Resp 22  Ht 5\' 6"  (1.676 m)  Wt 344 lb 11.2 oz (156.355 kg)  BMI 55.66 kg/m2  SpO2 100% Physical Exam  Constitutional: She is oriented to person, place, and time. She appears well-developed and well-nourished. No distress.  HENT:  Head: Normocephalic and atraumatic.  Right Ear: Hearing, tympanic membrane, external ear and ear canal normal.  Left Ear: Hearing, tympanic membrane, external ear and ear canal normal.  Nose: Mucosal edema and rhinorrhea present.  Mouth/Throat: Posterior oropharyngeal erythema present. No oropharyngeal exudate or posterior oropharyngeal edema.  Nose with mucosal edema and congestion. Uvula midline. No trismus  Eyes: Conjunctivae and EOM are normal.  Neck: Neck supple. No tracheal deviation present.  Cardiovascular: Regular rhythm.  Tachycardia present.   Slightly tachycardic. Otherwise regular.  Pulmonary/Chest: Effort normal. No respiratory distress.  Lungs are clear to auscultation bilaterally.    Musculoskeletal: Normal range of motion.  Neurological: She is alert and oriented to person, place, and time.  Skin: Skin is warm and dry.  Psychiatric: She has a normal mood and affect. Her behavior is normal.  Nursing note and vitals reviewed.   ED Course  Procedures (including critical care time)  DIAGNOSTIC STUDIES: Oxygen Saturation is 100% on RA, normal by my interpretation.    COORDINATION OF CARE: 11:13 AM - Discussed treatment plan with pt at  bedside which includes awaiting rapid strep screen. Pt verbalized understanding and agreed to plan.   Labs Review Labs Reviewed  RAPID STREP SCREEN (NOT AT [facility]) - Abnormal; Notable for the following:    Streptococcus, Group A Screen (Direct) POSITIVE (*)    All other components within normal limits    MDM   Final diagnoses:  Strep throat    Patient with some throat, low-grade fever here of 100. No evidence of peritonsillar abscess, uvula midline. No difficulty swallowing. Rapid strep is positive. Patient elected to be treated with oral antibiotics. Home with penicillin. Follow up with pcp. Return precautions discussed.   Filed Vitals:   07/06/15 1106  BP: 152/86  Pulse: 105  Temp: 100 F (37.8 C)  TempSrc: Oral  Resp: 22  Height:  (1.676 m)  Weight: 156.355 kg  SpO2: 100%   I personally performed the services described in this documentation, which was scribed in my presence. The recorded information has been reviewed and is accurate.   Jaynie Crumble, PA-C 07/06/15 1542  Arby Barrette, MD 07/06/15 [phone]

## 2015-12-20 ENCOUNTER — Other Ambulatory Visit: Payer: Self-pay

## 2015-12-20 DIAGNOSIS — Z1231 Encounter for screening mammogram for malignant neoplasm of breast: Secondary | ICD-10-CM

## 2016-01-03 ENCOUNTER — Ambulatory Visit
Admission: RE | Admit: 2016-01-03 | Discharge: 2016-01-03 | Disposition: A | Discharge status: Home / Self Care | Payer: Medicaid Other | Source: Ambulatory Visit

## 2016-01-03 DIAGNOSIS — Z1231 Encounter for screening mammogram for malignant neoplasm of breast: Secondary | ICD-10-CM

## 2016-01-05 ENCOUNTER — Other Ambulatory Visit: Payer: Self-pay | Admitting: Internal Medicine

## 2016-01-05 DIAGNOSIS — R928 Other abnormal and inconclusive findings on diagnostic imaging of breast: Secondary | ICD-10-CM

## 2016-01-12 ENCOUNTER — Ambulatory Visit
Admission: RE | Admit: 2016-01-12 | Discharge: 2016-01-12 | Disposition: A | Discharge status: Home / Self Care | Payer: Medicaid Other | Source: Ambulatory Visit | Attending: Internal Medicine | Admitting: Internal Medicine

## 2016-01-12 DIAGNOSIS — R928 Other abnormal and inconclusive findings on diagnostic imaging of breast: Secondary | ICD-10-CM

## 2016-07-27 ENCOUNTER — Other Ambulatory Visit: Payer: Self-pay | Admitting: Internal Medicine

## 2016-07-27 DIAGNOSIS — R921 Mammographic calcification found on diagnostic imaging of breast: Secondary | ICD-10-CM

## 2016-08-07 ENCOUNTER — Ambulatory Visit
Admission: RE | Admit: 2016-08-07 | Discharge: 2016-08-07 | Disposition: A | Discharge status: Home / Self Care | Payer: Medicaid Other | Source: Ambulatory Visit | Attending: Internal Medicine | Admitting: Internal Medicine

## 2016-08-07 ENCOUNTER — Other Ambulatory Visit: Payer: Self-pay | Admitting: Internal Medicine

## 2016-08-07 DIAGNOSIS — R921 Mammographic calcification found on diagnostic imaging of breast: Secondary | ICD-10-CM

## 2017-01-22 ENCOUNTER — Other Ambulatory Visit: Payer: Self-pay | Admitting: Internal Medicine

## 2017-01-22 DIAGNOSIS — R921 Mammographic calcification found on diagnostic imaging of breast: Secondary | ICD-10-CM

## 2017-02-02 ENCOUNTER — Ambulatory Visit
Admission: RE | Admit: 2017-02-02 | Discharge: 2017-02-02 | Disposition: A | Discharge status: Home / Self Care | Payer: Medicaid Other | Source: Ambulatory Visit | Attending: Internal Medicine | Admitting: Internal Medicine

## 2017-02-02 ENCOUNTER — Other Ambulatory Visit: Payer: Self-pay | Admitting: Internal Medicine

## 2017-02-02 DIAGNOSIS — R921 Mammographic calcification found on diagnostic imaging of breast: Secondary | ICD-10-CM

## 2017-11-06 ENCOUNTER — Other Ambulatory Visit: Payer: Self-pay

## 2017-11-06 ENCOUNTER — Emergency Department (HOSPITAL_COMMUNITY): Payer: Medicaid Other

## 2017-11-06 ENCOUNTER — Emergency Department (HOSPITAL_COMMUNITY)
Admission: EM | Admit: 2017-11-06 | Discharge: 2017-11-06 | Disposition: A | Discharge status: Home / Self Care | Payer: Medicaid Other | Attending: Emergency Medicine | Admitting: Emergency Medicine

## 2017-11-06 ENCOUNTER — Encounter (HOSPITAL_COMMUNITY): Payer: Self-pay | Admitting: Emergency Medicine

## 2017-11-06 DIAGNOSIS — R2243 Localized swelling, mass and lump, lower limb, bilateral: Secondary | ICD-10-CM | POA: Insufficient documentation

## 2017-11-06 DIAGNOSIS — I10 Essential (primary) hypertension: Secondary | ICD-10-CM | POA: Insufficient documentation

## 2017-11-06 DIAGNOSIS — J45909 Unspecified asthma, uncomplicated: Secondary | ICD-10-CM | POA: Diagnosis not present

## 2017-11-06 DIAGNOSIS — R0602 Shortness of breath: Secondary | ICD-10-CM | POA: Diagnosis not present

## 2017-11-06 DIAGNOSIS — Z79899 Other long term (current) drug therapy: Secondary | ICD-10-CM | POA: Insufficient documentation

## 2017-11-06 DIAGNOSIS — R42 Dizziness and giddiness: Secondary | ICD-10-CM | POA: Diagnosis not present

## 2017-11-06 DIAGNOSIS — R251 Tremor, unspecified: Secondary | ICD-10-CM | POA: Diagnosis not present

## 2017-11-06 DIAGNOSIS — R002 Palpitations: Secondary | ICD-10-CM | POA: Diagnosis not present

## 2017-11-06 DIAGNOSIS — F419 Anxiety disorder, unspecified: Secondary | ICD-10-CM | POA: Insufficient documentation

## 2017-11-06 HISTORY — DX: Anxiety disorder, unspecified: F41.9

## 2017-11-06 LAB — TSH: TSH: 0.815 u[IU]/mL (ref 0.350–4.500)

## 2017-11-06 LAB — CBC
HCT: 43.9 % (ref 36.0–46.0)
Hemoglobin: 15 g/dL (ref 12.0–15.0)
MCH: 31.5 pg (ref 26.0–34.0)
MCHC: 34.2 g/dL (ref 30.0–36.0)
MCV: 92.2 fL (ref 78.0–100.0)
PLATELETS: 441 10*3/uL — AB (ref 150–400)
RBC: 4.76 MIL/uL (ref 3.87–5.11)
RDW: 12.8 % (ref 11.5–15.5)
WBC: 8.3 10*3/uL (ref 4.0–10.5)

## 2017-11-06 LAB — CBG MONITORING, ED: GLUCOSE-CAPILLARY: 71 mg/dL (ref 65–99)

## 2017-11-06 LAB — BASIC METABOLIC PANEL
Anion gap: 11 (ref 5–15)
BUN: 8 mg/dL (ref 6–20)
CHLORIDE: 106 mmol/L (ref 101–111)
CO2: 21 mmol/L — ABNORMAL LOW (ref 22–32)
Calcium: 9.1 mg/dL (ref 8.9–10.3)
Creatinine, Ser: 0.96 mg/dL (ref 0.44–1.00)
GFR calc Af Amer: >60 mL/min (ref >60)
GFR calc non Af Amer: >60 mL/min (ref >60)
Glucose, Bld: 113 mg/dL — ABNORMAL HIGH (ref 65–99)
POTASSIUM: 3.2 mmol/L — AB (ref 3.5–5.1)
Sodium: 138 mmol/L (ref 135–145)

## 2017-11-06 LAB — I-STAT TROPONIN, ED: Troponin i, poc: 0 ng/mL (ref 0.00–0.08)

## 2017-11-06 LAB — I-STAT BETA HCG BLOOD, ED (MC, WL, AP ONLY)

## 2017-11-06 LAB — D-DIMER, QUANTITATIVE: D-Dimer, Quant: 0.78 ug/mL-FEU — ABNORMAL HIGH (ref 0.00–0.50)

## 2017-11-06 MED ORDER — IOPAMIDOL (ISOVUE-370) INJECTION 76%
INTRAVENOUS | Status: AC
  Filled 2017-11-06: qty 100

## 2017-11-06 MED ORDER — SODIUM CHLORIDE 0.9 % IV BOLUS
1000.0000 mL | Freq: Once | INTRAVENOUS | Status: AC
  Administered 2017-11-06: 1000 mL via INTRAVENOUS

## 2017-11-06 MED ORDER — IOPAMIDOL (ISOVUE-370) INJECTION 76%
100.0000 mL | Freq: Once | INTRAVENOUS | Status: AC | PRN
  Administered 2017-11-06: 100 mL via INTRAVENOUS

## 2017-11-06 MED ORDER — METOPROLOL TARTRATE 25 MG PO TABS
25.0000 mg | ORAL_TABLET | Freq: Once | ORAL | Status: AC
  Administered 2017-11-06: 25 mg via ORAL
  Filled 2017-11-06: qty 1

## 2017-11-06 MED ORDER — POTASSIUM CHLORIDE CRYS ER 20 MEQ PO TBCR
40.0000 meq | EXTENDED_RELEASE_TABLET | Freq: Once | ORAL | Status: AC
  Administered 2017-11-06: 40 meq via ORAL
  Filled 2017-11-06: qty 2

## 2017-11-06 NOTE — ED Notes (Signed)
This EMT collected I-stat troponin specimen at this time for un-resulted troponin order earlier.

## 2017-11-06 NOTE — ED Provider Notes (Signed)
[facility] Provider Note   CSN: [id number] Arrival date & time: 11/06/17  1052     History   Chief Complaint No chief complaint on file.   HPI Danielle Rojas is a 47 y.o. female.  The history is provided by the patient and medical records. No language interpreter was used.   Danielle Dolinanecia Botkins is a 47 y.o. female  with a PMH of HTN, HLD who presents to the Emergency Department complaining of palpitations over the last week. Patient reports feeling this sensation daily, however some times are worse than others. She also endorses feeling "shaky" all over as well as mild lower extremity swelling. She will occasionally get LE swelling and does not feel as if this is new for her. She has had 3-4 episodes of lightheadedness where she felt as if she would pass out. She denies syncopal episode.  Patient states that she has had this feeling once before.  She does not know why or remember if she was placed on medication for this.  It was several years ago.  Of note, she is on metoprolol.  She reports compliance with this medication, but did not take her dose this morning.  I have spoken with her pharmacy who states her prescription was last filled on March 5th, so it is likely she has missed more than just today's dose recently.  She denies chest pain.  She does report intermittent feeling of shortness of breath.  Denies history of DVT or PE.  No prolonged immobilizations/surgeries/long travel.   Past Medical History:  Diagnosis Date  . Anxiety   . Depression   . GERD (gastroesophageal reflux disease)   . Hyperlipidemia   . Hypertension   . Obesity     Patient Active Problem List   Diagnosis Date Noted  . DRY SKIN 09/30/2010  . FATIGUE 09/30/2010  . ACUTE CYSTITIS 05/02/2010  . HYPERLIPIDEMIA 10/04/2009  . ANXIETY 10/04/2009  . GERD 10/04/2009  . VAGINITIS, BACTERIAL 10/04/2009  . URINALYSIS, ABNORMAL 10/04/2009  . DIZZINESS 06/24/2008  .  PALPITATIONS 06/15/2008  . OTITIS MEDIA, ACUTE, LEFT 04/22/2007  . DECREASED HEARING, LEFT EAR 04/22/2007  . HYPERTENSION 04/22/2007  . RHINITIS, ALLERGIC NOS 04/22/2007  . OBESITY 04/16/2007  . ASTHMA 04/16/2007  . CHOLECYSTECTOMY, HX OF 04/16/2007    Past Surgical History:  Procedure Laterality Date  . GALL STONE REMOVAL       OB History   None      Home Medications    Prior to Admission medications   Medication Sig Start Date End Date Taking? Authorizing Provider  cephALEXin (KEFLEX) 500 MG capsule Take 1 capsule (500 mg total) by mouth 3 (three) times daily. 04/20/12   Reuben LikesKeller, David C, MD  Esomeprazole Magnesium (NEXIUM PO) Take by mouth.    [provider]  ibuprofen (ADVIL,MOTRIN) 600 MG tablet Take 1 tablet (600 mg total) by mouth every 6 (six) hours as needed. 07/06/15   Kirichenko, Tatyana, PA-C  LISINOPRIL PO Take by mouth.    [provider]  METOPROLOL TARTRATE PO Take by mouth.    [provider]  phenazopyridine (PYRIDIUM) 200 MG tablet Take 1 tablet (200 mg total) by mouth 3 (three) times daily as needed for pain. 04/20/12   Reuben LikesKeller, David C, MD  Sertraline HCl (ZOLOFT PO) Take by mouth.    [provider]  SIMVASTATIN PO Take by mouth.    [provider]    Family History History reviewed. No pertinent family  history.  Social History Social History   Tobacco Use  . Smoking status: Never Smoker  . Smokeless tobacco: Never Used  Substance Use Topics  . Alcohol use: No  . Drug use: No     Allergies   Patient has no known allergies.   Review of Systems Review of Systems  Respiratory: Positive for shortness of breath. Negative for cough and wheezing.   Cardiovascular: Positive for palpitations and leg swelling. Negative for chest pain.  Neurological: Positive for light-headedness. Negative for dizziness, syncope, weakness, numbness and headaches.  Psychiatric/Behavioral: The patient is nervous/anxious.     All other systems reviewed and are negative.    Physical Exam Updated Vital Signs BP 124/80   Pulse 85   Temp 99.1 F (37.3 C) (Oral)   Resp 16   LMP 10/15/2017 Comment: Tubal Ligation  SpO2 99%   Physical Exam  Constitutional: She is oriented to person, place, and time. She appears well-developed and well-nourished. No distress.  HENT:  Head: Normocephalic and atraumatic.  Cardiovascular: Normal heart sounds.  No murmur heard. Tachycardic but regular.  Pulmonary/Chest: Effort normal and breath sounds normal. No respiratory distress.  Abdominal: Soft. She exhibits no distension. There is no tenderness.  Musculoskeletal:  No lower extremity edema or calf tenderness appreciated.  Neurological: She is alert and oriented to person, place, and time.  Skin: Skin is warm and dry.  Nursing note and vitals reviewed.    ED Treatments / Results  Labs (all labs ordered are listed, but only abnormal results are displayed) Labs Reviewed  BASIC METABOLIC PANEL - Abnormal; Notable for the following components:      Result Value   Potassium 3.2 (*)    CO2 21 (*)    Glucose, Bld 113 (*)    All other components within normal limits  CBC - Abnormal; Notable for the following components:   Platelets 441 (*)    All other components within normal limits  D-DIMER, QUANTITATIVE (NOT AT [facility]) - Abnormal; Notable for the following components:   D-Dimer, Quant 0.78 (*)    All other components within normal limits  TSH  I-STAT TROPONIN, ED  I-STAT BETA HCG BLOOD, ED ([facility], [facility], [facility] ONLY)  CBG MONITORING, ED    EKG EKG Interpretation  Date/Time:  Tuesday November 06 2017 11:03:11 EDT Ventricular Rate:  101 PR Interval:  142 QRS Duration: 78 QT Interval:  316 QTC Calculation: 409 R Axis:   54 Text Interpretation:  Sinus tachycardia Nonspecific ST abnormality Abnormal ECG irregular rhythm new from previous Confirmed by Frederick Peers [id number]) on 11/06/2017 5:41:21 PM   Radiology Dg Chest 2  View  Result Date: 11/06/2017 CLINICAL DATA:  Left-sided chest pain and shortness of Breath EXAM: CHEST - 2 VIEW COMPARISON:  02/01/2010 FINDINGS: The heart size and mediastinal contours are within normal limits. Both lungs are clear. The visualized skeletal structures are unremarkable. IMPRESSION: No active cardiopulmonary disease. Electronically Signed   By: Alcide Clever M.D.   On: 11/06/2017 11:55   Ct Angio Chest Pe W And/or Wo Contrast  Result Date: 11/06/2017 CLINICAL DATA:  Chest tightness, racing heart and nausea for 4 days. EXAM: CT ANGIOGRAPHY CHEST WITH CONTRAST TECHNIQUE: Multidetector CT imaging of the chest was performed using the standard protocol during bolus administration of intravenous contrast. Multiplanar CT image reconstructions and MIPs were obtained to evaluate the vascular anatomy. CONTRAST:  100 ml ISOVUE-370 IOPAMIDOL (ISOVUE-370) INJECTION 76% COMPARISON:  PA and lateral chest this same day and 02/01/2010. FINDINGS:  Cardiovascular: Satisfactory opacification of the pulmonary arteries to the segmental level. No evidence of pulmonary embolism. Normal heart size. No pericardial effusion. Mediastinum/Nodes: No enlarged mediastinal, hilar, or axillary lymph nodes. Thyroid gland, trachea, and esophagus demonstrate no significant findings. Lungs/Pleura: Lungs are clear. No pleural effusion or pneumothorax. Upper Abdomen: No acute abnormality. Small hiatal hernia noted. Status post cholecystectomy. Musculoskeletal: Negative. Review of the MIP images confirms the above findings. IMPRESSION: Negative for pulmonary embolus.  No acute abnormality. Small hiatal hernia. Electronically Signed   By: Drusilla Kanner M.D.   On: 11/06/2017 19:02    Procedures Procedures (including critical care time)  Medications Ordered in ED Medications  iopamidol (ISOVUE-370) 76 % injection (has no administration in time range)  sodium chloride 0.9 % bolus 1,000 mL (0 mLs Intravenous Stopped 11/06/17 2013)    metoprolol tartrate (LOPRESSOR) tablet 25 mg (25 mg Oral Given 11/06/17 1756)  potassium chloride SA (K-DUR,KLOR-CON) CR tablet 40 mEq (40 mEq Oral Given 11/06/17 1756)  iopamidol (ISOVUE-370) 76 % injection 100 mL (100 mLs Intravenous Contrast Given 11/06/17 1843)     Initial Impression / Assessment and Plan / ED Course  I have reviewed the triage vital signs and the nursing notes.  Pertinent labs & imaging results that were available during my care of the patient were reviewed by me and considered in my medical decision making (see chart for details).    Caroleann Casler is a 47 y.o. female who presents to ED for palpitations x 1 week. Upon arrival, patient quite tachycardic with HR in high 120's. Afebrile, hemodynamically stable.  EKG reviewed with attending and reassuring.  Lab work reviewed: Mild hypokalemia 3.2 which was replenished in the ED.  TSH and trop WDL.  D-dimer was obtained given tachycardia along with complaints of lower extremity swelling although no swelling was appreciable on exam.  D-dimer was mildly elevated at 0.78.  Proceeded with CT angios which showed no evidence of PE or other acute findings.  Of note, patient is supposed to be on metoprolol 25 mg twice daily per her pharmacist at [facility].  She reports taking this as directed, but missed this morning.  Per pharmacy, her last prescription was filled on March 5, so I am concerned that she may not be as compliant as she is endorsing. ?  If this is contributory to her tachycardia today.  Her home medication was given and heart rate did improve drastically.  Upon discharge, heart rate of 85 with BP of 124/80.  She feels improved as well.  She notes seeing a cardiologist several years ago, but has not followed up as she should have.  Given another referral over to cardiology and stressed the importance of this as well as medication adherence.  Reasons to return to the ER were discussed and all questions answered.  Patient discussed with  Dr. Clarene Duke who agrees with treatment plan.    Final Clinical Impressions(s) / ED Diagnoses   Final diagnoses:  Palpitations    ED Discharge Orders    None       My [place], Chase Picket, PA-C 11/06/17 2034    Little, Ambrose Finland, MD 11/06/17 [phone]

## 2017-11-06 NOTE — ED Notes (Signed)
Pt appears very anxious and shaky. Pt states she has felt anxious

## 2017-11-06 NOTE — ED Notes (Signed)
Pt verbalized understanding of discharge instructions.

## 2017-11-06 NOTE — ED Triage Notes (Signed)
Pt states she feels like her heart is racing and she feels shaky, some chest tightness. This has been going on since Sat. Pt also feels nauseous.

## 2017-11-06 NOTE — ED Notes (Signed)
Pt ambulated to the restroom without difficulty. Gait steady/even. 

## 2017-11-06 NOTE — Discharge Instructions (Signed)
It was my pleasure taking care of you today!   Fortunately, your work up today was reassuring.  It is very important that you take all of your medications as directed without any missed doses.  It is also important that you follow up with the cardiology clinic. Please call tomorrow morning to schedule a follow up appointment.  Return to ER for new or worsening symptoms, any additional concerns.

## 2017-11-06 NOTE — ED Notes (Signed)
Patient transported to CT 

## 2017-11-08 ENCOUNTER — Ambulatory Visit (HOSPITAL_COMMUNITY)
Admission: EM | Admit: 2017-11-08 | Discharge: 2017-11-08 | Disposition: A | Discharge status: Home / Self Care | Payer: Medicaid Other | Attending: Internal Medicine | Admitting: Internal Medicine

## 2017-11-08 ENCOUNTER — Encounter (HOSPITAL_COMMUNITY): Payer: Self-pay | Admitting: Emergency Medicine

## 2017-11-08 DIAGNOSIS — R251 Tremor, unspecified: Secondary | ICD-10-CM

## 2017-11-08 MED ORDER — ONDANSETRON 4 MG PO TBDP: 4.0000 mg | ORAL_TABLET | Freq: Three times a day (TID) | ORAL | 0 refills | Status: DC | PRN

## 2017-11-08 MED ORDER — ESOMEPRAZOLE MAGNESIUM 20 MG PO CPDR: 20.0000 mg | DELAYED_RELEASE_CAPSULE | Freq: Every day | ORAL | 0 refills | Status: DC

## 2017-11-08 NOTE — ED Triage Notes (Signed)
Pt was seen in the ER two days ago for the same thing, pt states she feels shaky and doesn't feel herself. Pt had extensive workup which all came back negative. Pt here for the same.

## 2017-11-08 NOTE — Discharge Instructions (Signed)
Start zofran and nexium to help with nausea. Follow up with neurology for further evaluation of tremors. Otherwise, follow up with your PCP as scheduled, and follow up with cardiology as scheduled.

## 2017-11-08 NOTE — ED Provider Notes (Signed)
[facility]    CSN: [id number] Arrival date & time: 11/08/17  1605     History   Chief Complaint Chief Complaint  Patient presents with  . Shaking    HPI Danielle Rojas is a 47 y.o. female.   47 year old female with history of anxiety, depression, GERD, HLD, HTN comes in for continued tremors throughout the body.  She was seen at the ED 2 days ago for palpitations and tremors.  At that time, she had some shortness of breath with tachycardia. D-dimer was slightly elevated, other lab work was within normal limits.  CT was performed with negative PE.  Patient with history of palpitations on metoprolol, at the time at ED, she had not taken her metoprolol, and symptoms improved with medication and was discharged to follow-up with PCP and cardiology.  States started tremors today again that is throughout the whole body, worse with her head.  Denies any aggravating or alleviating factor.  Does get a little hot flash" chills.  She has been taking metoprolol as directed.  Denies worsening of symptoms with movement were writing.  Denies dizziness, imbalance. States has had some nausea and having trouble eating due to the nausea. Denies dysphagia, trouble swallowing, swelling of the throat. Denies personal or family history of Parkinson's.     Past Medical History:  Diagnosis Date  . Anxiety   . Depression   . GERD (gastroesophageal reflux disease)   . Hyperlipidemia   . Hypertension   . Obesity     Patient Active Problem List   Diagnosis Date Noted  . DRY SKIN 09/30/2010  . FATIGUE 09/30/2010  . ACUTE CYSTITIS 05/02/2010  . HYPERLIPIDEMIA 10/04/2009  . ANXIETY 10/04/2009  . GERD 10/04/2009  . VAGINITIS, BACTERIAL 10/04/2009  . URINALYSIS, ABNORMAL 10/04/2009  . DIZZINESS 06/24/2008  . PALPITATIONS 06/15/2008  . OTITIS MEDIA, ACUTE, LEFT 04/22/2007  . DECREASED HEARING, LEFT EAR 04/22/2007  . HYPERTENSION 04/22/2007  . RHINITIS, ALLERGIC NOS 04/22/2007  . OBESITY  04/16/2007  . ASTHMA 04/16/2007  . CHOLECYSTECTOMY, HX OF 04/16/2007    Past Surgical History:  Procedure Laterality Date  . GALL STONE REMOVAL      OB History   None      Home Medications    Prior to Admission medications   Medication Sig Start Date End Date Taking? Authorizing Provider  LISINOPRIL PO Take by mouth.   Yes [provider]  METOPROLOL TARTRATE PO Take by mouth.   Yes [provider]  SIMVASTATIN PO Take by mouth.   Yes [provider]  cephALEXin (KEFLEX) 500 MG capsule Take 1 capsule (500 mg total) by mouth 3 (three) times daily. Patient not taking: Reported on 11/08/2017 04/20/12   Reuben Likes, MD  esomeprazole (NEXIUM) 20 MG capsule Take 1 capsule (20 mg total) by mouth daily. 11/08/17   Cathie Hoops, Yanni Ruberg V, PA-C  ibuprofen (ADVIL,MOTRIN) 600 MG tablet Take 1 tablet (600 mg total) by mouth every 6 (six) hours as needed. 07/06/15   Kirichenko, Tatyana, PA-C  ondansetron (ZOFRAN ODT) 4 MG disintegrating tablet Take 1 tablet (4 mg total) by mouth every 8 (eight) hours as needed for nausea or vomiting. 11/08/17   Cathie Hoops, Meredith Kilbride V, PA-C  phenazopyridine (PYRIDIUM) 200 MG tablet Take 1 tablet (200 mg total) by mouth 3 (three) times daily as needed for pain. Patient not taking: Reported on 11/08/2017 04/20/12   Reuben Likes, MD  Sertraline HCl (ZOLOFT PO) Take by mouth.    [provider]    Family History No family history on file.  Social History Social History   Tobacco Use  . Smoking status: Never Smoker  . Smokeless tobacco: Never Used  Substance Use Topics  . Alcohol use: No  . Drug use: No     Allergies   Patient has no known allergies.   Review of Systems Review of Systems  Reason unable to perform ROS: See HPI as above.     Physical Exam Triage Vital Signs ED Triage Vitals  Enc Vitals Group     BP 11/08/17 1655 139/67     Pulse Rate 11/08/17 1655 100     Resp 11/08/17 1655 16     Temp 11/08/17 1655 98.9 F (37.2  C)     Temp src --      SpO2 11/08/17 1655 100 %     Weight --      Height --      Head Circumference --      Peak Flow --      Pain Score 11/08/17 1657 0     Pain Loc --      Pain Edu? --      Excl. in GC? --    No data found.  Updated Vital Signs BP 139/67   Pulse 100   Temp 98.9 F (37.2 C)   Resp 16   LMP 10/15/2017 Comment: Tubal Ligation  SpO2 100%   Physical Exam  Constitutional: She is oriented to person, place, and time. She appears well-developed and well-nourished. No distress.  HENT:  Head: Normocephalic and atraumatic.  Eyes: Pupils are equal, round, and reactive to light. Conjunctivae and EOM are normal.  Neck: Normal range of motion. Neck supple.  Cardiovascular: Normal rate, regular rhythm and normal heart sounds. Exam reveals no gallop and no friction rub.  No murmur heard. Pulmonary/Chest: Effort normal and breath sounds normal. She has no wheezes. She has no rales.  Neurological: She is alert and oriented to person, place, and time. She has normal strength. She is not disoriented. She displays tremor. No cranial nerve deficit or sensory deficit. She displays a negative Romberg sign. Coordination and gait normal. GCS eye subscore is 4. GCS verbal subscore is 5. GCS motor subscore is 6.  Visible tremor of the head and upper extremity.  Tremor does not worsen with finger to nose.  Normal finger to nose, rapid movement.  Skin: Skin is warm and dry.    UC Treatments / Results  Labs (all labs ordered are listed, but only abnormal results are displayed) Labs Reviewed - No data to display  EKG None Radiology No results found.  Procedures Procedures (including critical care time)  Medications Ordered in UC Medications - No data to display   Initial Impression / Assessment and Plan / UC Course  I have reviewed the triage vital signs and the nursing notes.  Pertinent labs & imaging results that were available during my care of the patient were  reviewed by me and considered in my medical decision making (see chart for details).    Patient with recent extensive workup with negative results.  Normal TSH, negative CTA.  Visible tremor of the head and upper extremity that is not worsened with finger to nose.  Other than tremor, neurology exam normal without focal findings.  Given patient has had some nausea with trouble eating due to tremors, will provide Zofran and Nexium to help with symptoms.  Otherwise follow-up with neurology for further evaluation  of tremors.  Follow-up with PCP and cardiology as scheduled for further evaluation.  Final Clinical Impressions(s) / UC Diagnoses   Final diagnoses:  Tremor    ED Discharge Orders        Ordered    ondansetron (ZOFRAN ODT) 4 MG disintegrating tablet  Every 8 hours PRN     11/08/17 1825    esomeprazole (NEXIUM) 20 MG capsule  Daily     11/08/17 1825        Belinda Fisher, PA-C 11/08/17 2141

## 2017-11-28 ENCOUNTER — Ambulatory Visit (INDEPENDENT_AMBULATORY_CARE_PROVIDER_SITE_OTHER): Payer: Medicaid Other | Admitting: Cardiovascular Disease

## 2017-11-28 ENCOUNTER — Encounter: Payer: Self-pay | Admitting: Cardiovascular Disease

## 2017-11-28 VITALS — BP 118/76 | HR 76 | Ht 67.0 in | Wt 329.0 lb

## 2017-11-28 DIAGNOSIS — R002 Palpitations: Secondary | ICD-10-CM

## 2017-11-28 NOTE — Assessment & Plan Note (Addendum)
History of essential hypertension blood pressure measures at 118/76 She is on metoprolol.

## 2017-11-28 NOTE — Assessment & Plan Note (Signed)
History of daily palpitations. We will obtain a two-week event monitor to further evaluate

## 2017-11-28 NOTE — Patient Instructions (Signed)
Medication Instructions: Your physician recommends that you continue on your current medications as directed. Please refer to the Current Medication list given to you today.  Testing/Procedures: Your physician has recommended that you wear a 14 day event monitor. Event monitors are medical devices that record the heart's electrical activity. Doctors most often us these monitors to diagnose arrhythmias. Arrhythmias are problems with the speed or rhythm of the heartbeat. The monitor is a small, portable device. You can wear one while you do your normal daily activities. This is usually used to diagnose what is causing palpitations/syncope (passing out).  Follow-Up: Your physician recommends that you schedule a follow-up appointment as needed with Dr. Berry.   

## 2017-11-28 NOTE — Assessment & Plan Note (Signed)
History of hyperlipidemia on statin therapy. 

## 2017-11-28 NOTE — Progress Notes (Signed)
11/28/2017 Danielle Rojas   [birth year]  [id number]  Primary Physician Fleet Contras, MD Primary Cardiologist: Runell Gess MD Nicholes Calamity, [place]  HPI:  Danielle Rojas is a 47 y.o. morbidly overweight single African-American female mother of 3 children is accompanied by her boyfriend Danielle Rojas. She was referred by the ER for palpitations. She does have a history of hypertension and hyperlipidemia on medications. There is no family history. She's never had a heart attack or stroke.She completed a daily palpitations however. She denies chest pain or shortness of breath.    Current Meds  Medication Sig  . METOPROLOL TARTRATE PO Take by mouth.  . Sertraline HCl (ZOLOFT PO) Take by mouth.  Marland Kitchen SIMVASTATIN PO Take by mouth.  . [DISCONTINUED] cephALEXin (KEFLEX) 500 MG capsule Take 1 capsule (500 mg total) by mouth 3 (three) times daily.  . [DISCONTINUED] esomeprazole (NEXIUM) 20 MG capsule Take 1 capsule (20 mg total) by mouth daily.  . [DISCONTINUED] ibuprofen (ADVIL,MOTRIN) 600 MG tablet Take 1 tablet (600 mg total) by mouth every 6 (six) hours as needed.  . [DISCONTINUED] LISINOPRIL PO Take by mouth.  . [DISCONTINUED] ondansetron (ZOFRAN ODT) 4 MG disintegrating tablet Take 1 tablet (4 mg total) by mouth every 8 (eight) hours as needed for nausea or vomiting.  . [DISCONTINUED] phenazopyridine (PYRIDIUM) 200 MG tablet Take 1 tablet (200 mg total) by mouth 3 (three) times daily as needed for pain.     No Known Allergies  Social History   Socioeconomic History  . Marital status: Single    Spouse name: Not on file  . Number of children: Not on file  . Years of education: Not on file  . Highest education level: Not on file  Occupational History  . Not on file  Social Needs  . Financial resource strain: Not on file  . Food insecurity:    Worry: Not on file    Inability: Not on file  . Transportation needs:    Medical: Not on file    Non-medical: Not on file  Tobacco Use  .  Smoking status: Never Smoker  . Smokeless tobacco: Never Used  Substance and Sexual Activity  . Alcohol use: No  . Drug use: No  . Sexual activity: Not on file  Lifestyle  . Physical activity:    Days per week: Not on file    Minutes per session: Not on file  . Stress: Not on file  Relationships  . Social connections:    Talks on phone: Not on file    Gets together: Not on file    Attends religious service: Not on file    Active member of club or organization: Not on file    Attends meetings of clubs or organizations: Not on file    Relationship status: Not on file  . Intimate partner violence:    Fear of current or ex partner: Not on file    Emotionally abused: Not on file    Physically abused: Not on file    Forced sexual activity: Not on file  Other Topics Concern  . Not on file  Social History Narrative  . Not on file     Review of Systems: General: negative for chills, fever, night sweats or weight changes.  Cardiovascular: negative for chest pain, dyspnea on exertion, edema, orthopnea, palpitations, paroxysmal nocturnal dyspnea or shortness of breath Dermatological: negative for rash Respiratory: negative for cough or wheezing Urologic: negative for hematuria Abdominal: negative for nausea, vomiting,  diarrhea, bright red blood per rectum, melena, or hematemesis Neurologic: negative for visual changes, syncope, or dizziness All other systems reviewed and are otherwise negative except as noted above.    Blood pressure 118/76, pulse 76, height  (1.702 m), weight (!) 329 lb (149.2 kg).  General appearance: alert and no distress Neck: no adenopathy, no carotid bruit, no JVD, supple, symmetrical, trachea midline and thyroid not enlarged, symmetric, no tenderness/mass/nodules Lungs: clear to auscultation bilaterally Heart: regular rate and rhythm, S1, S2 normal, no murmur, click, rub or gallop Extremities: extremities normal, atraumatic, no cyanosis or  edema Pulses: 2+ and symmetric Skin: Skin color, texture, turgor normal. No rashes or lesions Neurologic: Alert and oriented X 3, normal strength and tone. Normal symmetric reflexes. Normal coordination and gait  EKG not performed today  ASSESSMENT AND PLAN:   HYPERLIPIDEMIA History of hyperlipidemia on statin therapy  Essential hypertension History of essential hypertension blood pressure measures at 118/76 She is on metoprolol.  PALPITATIONS History of daily palpitations. We will obtain a two-week event monitor to further evaluate      Runell Gess MD [facility], [facility] 11/28/2017 1:03 PM

## 2017-12-11 ENCOUNTER — Ambulatory Visit (INDEPENDENT_AMBULATORY_CARE_PROVIDER_SITE_OTHER): Payer: Medicaid Other

## 2017-12-11 DIAGNOSIS — R002 Palpitations: Secondary | ICD-10-CM

## 2018-01-09 ENCOUNTER — Other Ambulatory Visit: Payer: Self-pay | Admitting: Internal Medicine

## 2018-01-09 DIAGNOSIS — Z1231 Encounter for screening mammogram for malignant neoplasm of breast: Secondary | ICD-10-CM

## 2018-02-06 ENCOUNTER — Ambulatory Visit
Admission: RE | Admit: 2018-02-06 | Discharge: 2018-02-06 | Disposition: A | Discharge status: Home / Self Care | Payer: Medicaid Other | Source: Ambulatory Visit | Attending: Internal Medicine | Admitting: Internal Medicine

## 2018-02-06 DIAGNOSIS — Z1231 Encounter for screening mammogram for malignant neoplasm of breast: Secondary | ICD-10-CM

## 2019-01-28 ENCOUNTER — Other Ambulatory Visit (HOSPITAL_BASED_OUTPATIENT_CLINIC_OR_DEPARTMENT_OTHER): Payer: Self-pay

## 2019-01-28 DIAGNOSIS — G4730 Sleep apnea, unspecified: Secondary | ICD-10-CM

## 2019-02-15 ENCOUNTER — Other Ambulatory Visit (HOSPITAL_COMMUNITY)
Admission: RE | Admit: 2019-02-15 | Discharge: 2019-02-15 | Disposition: A | Discharge status: Home / Self Care | Payer: Medicaid Other | Source: Ambulatory Visit | Attending: Internal Medicine | Admitting: Internal Medicine

## 2019-02-15 DIAGNOSIS — Z1159 Encounter for screening for other viral diseases: Secondary | ICD-10-CM | POA: Insufficient documentation

## 2019-02-15 LAB — SARS CORONAVIRUS 2 (TAT 6-24 HRS): SARS Coronavirus 2: NEGATIVE

## 2019-02-18 ENCOUNTER — Encounter (HOSPITAL_BASED_OUTPATIENT_CLINIC_OR_DEPARTMENT_OTHER): Payer: Self-pay | Admitting: Internal Medicine

## 2019-02-18 ENCOUNTER — Ambulatory Visit (HOSPITAL_BASED_OUTPATIENT_CLINIC_OR_DEPARTMENT_OTHER): Payer: Medicaid Other | Attending: Internal Medicine | Admitting: Internal Medicine

## 2019-02-18 ENCOUNTER — Other Ambulatory Visit: Payer: Self-pay

## 2019-02-18 DIAGNOSIS — G479 Sleep disorder, unspecified: Secondary | ICD-10-CM | POA: Diagnosis not present

## 2019-02-18 DIAGNOSIS — R0683 Snoring: Secondary | ICD-10-CM | POA: Diagnosis not present

## 2019-02-18 DIAGNOSIS — G4730 Sleep apnea, unspecified: Secondary | ICD-10-CM | POA: Insufficient documentation

## 2019-02-18 HISTORY — DX: Sleep apnea, unspecified: G47.30

## 2019-02-23 DIAGNOSIS — G4730 Sleep apnea, unspecified: Secondary | ICD-10-CM

## 2019-02-23 NOTE — Procedures (Signed)
    Patient Name: Danielle Rojas, Danielle Rojas Date: 02/18/2019 Gender: Female D.O.B: [birth year] Age (years): 48 Referring Provider: Nolene Ebbs Height (inches): 87 Interpreting Physician: Baird Lyons MD, ABSM Weight (lbs): 329 RPSGT: Carolin Coy BMI: 52 MRN: [id number] Neck Size: 15.50  CLINICAL INFORMATION Sleep Study Type: NPSG Indication for sleep study: Fatigue, Hypertension, Obesity, Snoring Epworth Sleepiness Score: 9  SLEEP STUDY TECHNIQUE As per the AASM Manual for the Scoring of Sleep and Associated Events v2.3 (April 2016) with a hypopnea requiring 4% desaturations.  The channels recorded and monitored were frontal, central and occipital EEG, electrooculogram (EOG), submentalis EMG (chin), nasal and oral airflow, thoracic and abdominal wall motion, anterior tibialis EMG, snore microphone, electrocardiogram, and pulse oximetry.  MEDICATIONS Medications self-administered by patient taken the night of the study : none reported  SLEEP ARCHITECTURE The study was initiated at 9:56:35 PM and ended at 4:28:39 AM.  Sleep onset time was 232.9 minutes and the sleep efficiency was 21.9%%. The total sleep time was 86 minutes.  Stage REM latency was N/A minutes.  The patient spent 27.9%% of the night in stage N1 sleep, 72.1%% in stage N2 sleep, 0.0%% in stage N3 and 0% in REM.  Alpha intrusion was absent.  Supine sleep was 26.74%.  RESPIRATORY PARAMETERS The overall apnea/hypopnea index (AHI) was 0.0 per hour. There were 0 total apneas, including 0 obstructive, 0 central and 0 mixed apneas. There were 0 hypopneas and 87 RERAs.  The AHI during Stage REM sleep was N/A per hour.  AHI while supine was 0.0 per hour.  The mean oxygen saturation was 95.8%. The minimum SpO2 during sleep was 94.0%.  soft snoring was noted during this study.  CARDIAC DATA The 2 lead EKG demonstrated sinus rhythm. The mean heart rate was 75.1 beats per minute. Other EKG findings include: None.   LEG MOVEMENT DATA The total PLMS were 0 with a resulting PLMS index of 0.0. Associated arousal with leg movement index was 0.0 .  IMPRESSIONS - No significant obstructive sleep apnea occurred during this study (AHI = 0.0/h). - No significant central sleep apnea occurred during this study (CAI = 0.0/h). - The patient had minimal or no oxygen desaturation during the study (Min O2 = 94.0%) - The patient snored with soft snoring volume. - No cardiac abnormalities were noted during this study. - Clinically significant periodic limb movements did not occur during sleep. No significant associated arousals. - Sleep onsetwas markedlydelayed, with sleep onset 1:50 AM and frequent subsequent waking and arousals with no deep sleep.  DIAGNOSIS - Sleep disorder  RECOMMENDATIONS - Consider repeating study with a sleep aid, if sleep disordered breathing is strongly suspected. Otherwise, consider if managing as insomnia would be appropriate. - Sleep hygiene should be reviewed to assess factors that may improve sleep quality. - Weight management and regular exercise should be initiated or continued if appropriate.  [Electronically signed] 02/23/2019 11:31 AM  Baird Lyons MD, ABSM Diplomate, American Board of Sleep Medicine   NPI: [id number]                         [place] [place] [facility]  ELECTRONICALLY SIGNED ON:  02/23/2019, 11:27 AM [place] PH: [phone]   FX: [phone] [place]

## 2021-09-25 ENCOUNTER — Emergency Department (HOSPITAL_COMMUNITY): Payer: Medicaid Other

## 2021-09-25 ENCOUNTER — Other Ambulatory Visit: Payer: Self-pay

## 2021-09-25 ENCOUNTER — Encounter (HOSPITAL_COMMUNITY): Payer: Self-pay | Admitting: Emergency Medicine

## 2021-09-25 ENCOUNTER — Emergency Department (HOSPITAL_COMMUNITY)
Admission: EM | Admit: 2021-09-25 | Discharge: 2021-09-26 | Disposition: A | Discharge status: Home / Self Care | Payer: Medicaid Other | Attending: Emergency Medicine | Admitting: Emergency Medicine

## 2021-09-25 DIAGNOSIS — R000 Tachycardia, unspecified: Secondary | ICD-10-CM | POA: Insufficient documentation

## 2021-09-25 DIAGNOSIS — M79662 Pain in left lower leg: Secondary | ICD-10-CM | POA: Insufficient documentation

## 2021-09-25 DIAGNOSIS — I10 Essential (primary) hypertension: Secondary | ICD-10-CM | POA: Insufficient documentation

## 2021-09-25 DIAGNOSIS — R200 Anesthesia of skin: Secondary | ICD-10-CM | POA: Insufficient documentation

## 2021-09-25 DIAGNOSIS — M5432 Sciatica, left side: Secondary | ICD-10-CM

## 2021-09-25 DIAGNOSIS — Z79899 Other long term (current) drug therapy: Secondary | ICD-10-CM | POA: Diagnosis not present

## 2021-09-25 DIAGNOSIS — M79605 Pain in left leg: Secondary | ICD-10-CM

## 2021-09-25 LAB — CBC WITH DIFFERENTIAL/PLATELET
Abs Immature Granulocytes: 0.03 10*3/uL (ref 0.00–0.07)
Basophils Absolute: 0 10*3/uL (ref 0.0–0.1)
Basophils Relative: 0 %
Eosinophils Absolute: 0 10*3/uL (ref 0.0–0.5)
Eosinophils Relative: 0 %
HCT: 41.2 % (ref 36.0–46.0)
Hemoglobin: 13.3 g/dL (ref 12.0–15.0)
Immature Granulocytes: 0 %
Lymphocytes Relative: 20 %
Lymphs Abs: 2 10*3/uL (ref 0.7–4.0)
MCH: 30.5 pg (ref 26.0–34.0)
MCHC: 32.3 g/dL (ref 30.0–36.0)
MCV: 94.5 fL (ref 80.0–100.0)
Monocytes Absolute: 0.8 10*3/uL (ref 0.1–1.0)
Monocytes Relative: 8 %
Neutro Abs: 7 10*3/uL (ref 1.7–7.7)
Neutrophils Relative %: 72 %
Platelets: 427 10*3/uL — ABNORMAL HIGH (ref 150–400)
RBC: 4.36 MIL/uL (ref 3.87–5.11)
RDW: 12.6 % (ref 11.5–15.5)
WBC: 9.9 10*3/uL (ref 4.0–10.5)
nRBC: 0 % (ref 0.0–0.2)

## 2021-09-25 LAB — BASIC METABOLIC PANEL WITH GFR
Anion gap: 8 (ref 5–15)
BUN: 8 mg/dL (ref 6–20)
CO2: 23 mmol/L (ref 22–32)
Calcium: 8.8 mg/dL — ABNORMAL LOW (ref 8.9–10.3)
Chloride: 106 mmol/L (ref 98–111)
Creatinine, Ser: 0.92 mg/dL (ref 0.44–1.00)
GFR, Estimated: >60 mL/min
Glucose, Bld: 106 mg/dL — ABNORMAL HIGH (ref 70–99)
Potassium: 4 mmol/L (ref 3.5–5.1)
Sodium: 137 mmol/L (ref 135–145)

## 2021-09-25 MED ORDER — OXYCODONE-ACETAMINOPHEN 5-325 MG PO TABS
2.0000 | ORAL_TABLET | Freq: Once | ORAL | Status: AC
  Administered 2021-09-25: 2 via ORAL
  Filled 2021-09-25: qty 2

## 2021-09-25 NOTE — ED Triage Notes (Signed)
Pt reported to ED with c/o pain to LLE. States that onset of pain began upon awakening this AM and that it is so severe pt cannot walk. Denies any warmth to touch, recent travel or injury or trauma to leg. Says pain starts around "tailbone" and travels down back of leg. Denies hx of sciatica.

## 2021-09-25 NOTE — ED Provider Notes (Signed)
[facility] EMERGENCY DEPARTMENT Provider Note   CSN: [id number] Arrival date & time: 09/25/21  2138     History  Chief Complaint  Patient presents with   Leg Pain    Danielle Rojas is a 51 y.o. female.  51 year old female presents today for evaluation of acute onset left lower extremity pain that she describes as sharp and shooting with associated numbness to her left foot.  Denies injury to left lower extremity or trauma.  Denies prior history of back pain.  States difficulty ambulating today.  Reports at baseline she is able to ambulate and do all activities independently.  Denies fever, chills, abdominal pain, nausea, vomiting.  Reports minimal swelling to left foot, otherwise without swelling to either lower extremity.  She denies difficulty with bowel or bladder control, history of cancer, history of IV drug abuse, unintentional weight loss, trauma to back, recent prolonged travel, recent surgery, use of birth control or other hormone therapy.  She does state pain improves when she is lying down on her right side.  The history is provided by the patient. No language interpreter was used.      Home Medications Prior to Admission medications   Medication Sig Start Date End Date Taking? Authorizing Provider  METOPROLOL TARTRATE PO Take by mouth.    [provider]  Sertraline HCl (ZOLOFT PO) Take by mouth.    [provider]  SIMVASTATIN PO Take by mouth.    [provider]      Allergies    Patient has no known allergies.    Review of Systems   Review of Systems  Constitutional:  Negative for chills and fever.  Respiratory:  Negative for shortness of breath.   Cardiovascular:  Negative for chest pain and leg swelling.  Gastrointestinal:  Negative for abdominal pain, nausea and vomiting.  Neurological:  Negative for weakness and light-headedness.  All other systems reviewed and are negative.  Physical Exam Updated Vital  Signs BP 138/88 (BP Location: Left Arm)    Pulse 100    Temp 98.6 F (37 C) (Oral)    Resp 20    Ht 5\' 7"  (1.702 m)    Wt (!) 158.8 kg    SpO2 99%    BMI 54.82 kg/m  Physical Exam Vitals and nursing note reviewed.  Constitutional:      General: She is not in acute distress.    Appearance: Normal appearance. She is not ill-appearing.  HENT:     Head: Normocephalic and atraumatic.     Nose: Nose normal.  Eyes:     Conjunctiva/sclera: Conjunctivae normal.  Cardiovascular:     Rate and Rhythm: Regular rhythm. Tachycardia present.  Pulmonary:     Effort: Pulmonary effort is normal. No respiratory distress.     Breath sounds: Normal breath sounds. No wheezing or rales.  Abdominal:     General: There is no distension.     Palpations: Abdomen is soft.     Tenderness: There is no abdominal tenderness. There is no guarding.  Musculoskeletal:        General: No deformity.     Right lower leg: No edema.     Left lower leg: No edema (Minimal nonpitting edema to the left foot.  Otherwise left lower extremity without swelling.).     Comments: Diffuse tenderness to palpation present of left lower extremity.  Positive straight leg raise test on left lower extremity.  Shooting pain in left lower extremity with raising of  the right lower extremity.  2+ DP pulse present bilaterally.  Brisk cap refill.  Sensation intact and symmetrical in bilateral lower extremities.  Joints without erythema, or swelling. Cervical, thoracic, lumbar spine without tenderness palpation.  Lumbar paraspinal muscles without tenderness to palpation.  Skin:    Findings: No rash.  Neurological:     Mental Status: She is alert.    ED Results / Procedures / Treatments   Labs (all labs ordered are listed, but only abnormal results are displayed) Labs Reviewed - No data to display  EKG None  Radiology No results found.  Procedures Procedures    Medications Ordered in ED Medications  oxyCODONE-acetaminophen  (PERCOCET/ROXICET) 5-325 MG per tablet 2 tablet (2 tablets Oral Given 09/25/21 2252)    ED Course/ Medical Decision Making/ A&P                           Medical Decision Making Amount and/or Complexity of Data Reviewed Labs: ordered. Radiology: ordered.  Risk Prescription drug management.   Medical Decision Making / ED Course   This patient presents to the ED for concern of left lower extremity pain, this involves an extensive number of treatment options, and is a complaint that carries with it a high risk of complications and morbidity.  The differential diagnosis includes sciatica, piriformis syndrome, septic joint, DVT  MDM: 51 year old female presents today for evaluation of left lower extremity pain since this morning.  Reports its been difficult to ambulate since then.  Denies fever, chills, chest pain, shortness of breath.  Minimal swelling to left foot otherwise lower extremities without swelling.  Without red flag signs or symptoms of cauda equina, spinal epidural abscess.  Without risk factors of DVT or PE.  She has not tried anything prior to arrival.  Positive straight leg raise test on the left and positive for opposite straight leg raise test as well.  Exam without evidence of septic joint.  Will provide pain medication, obtain x-ray of left hip, left knee, obtain CBC, BMP, and reevaluate. Less likely DVT given no risk factors.  Doubt septic joint given diffuse pain in lower extremity.  Will obtain CBC to rule out leukocytosis or signs of other infection.  CBC without leukocytosis.  Doubt septic arthritis.  Pain significantly improved following dose of 10 mg Norco.  Patient with much improved range of motion and she is able to ambulate and bear weight with minimal pain.  She still as having mild tenderness palpation of left hip.   Lab Tests: -I ordered, reviewed, and interpreted labs.   The pertinent results include:   Labs Reviewed - No data to display    EKG  EKG  Interpretation  Date/Time:    Ventricular Rate:    PR Interval:    QRS Duration:   QT Interval:    QTC Calculation:   R Axis:     Text Interpretation:           Imaging Studies ordered: I ordered imaging studies including left hip and knee x-ray I independently visualized and interpreted imaging. I agree with the radiologist interpretation   Medicines ordered and prescription drug management: Meds ordered this encounter  Medications   oxyCODONE-acetaminophen (PERCOCET/ROXICET) 5-325 MG per tablet 2 tablet    -I have reviewed the patients home medicines and have made adjustments as needed  Reevaluation: After the interventions noted above, I reevaluated the patient and found that they have :improved Patient with much improved  range of motion with full knee flexion, hip flexion and extension.  I personally ambulated patient which she was able to without much difficulty.  Patient able to bear weight without complaint of pain.  Mild left hip pain present on reevaluation.  Co morbidities that complicate the patient evaluation  Past Medical History:  Diagnosis Date   Anxiety    Depression    GERD (gastroesophageal reflux disease)    Hyperlipidemia    Hypertension    Obesity    Sleep apnea, unspecified 02/18/2019     Dispostion: At the end of shift patient is awaiting x-rays.  As long as x-rays are negative for acute process patient is appropriate for discharge with follow-up with PCP.  Patient has some significant improvement in symptoms following a dose of pain medication.  Disposition per oncoming provider.   Final Clinical Impression(s) / ED Diagnoses Final diagnoses:  Left leg pain    Rx / DC Orders ED Discharge Orders          Ordered    naproxen (NAPROSYN) 375 MG tablet  2 times daily        09/26/21 0002    HYDROcodone-acetaminophen (NORCO/VICODIN) 5-325 MG tablet  Every 6 hours PRN        09/26/21 0002              Marita Kansas, PA-C 09/26/21  0005    Terrilee Files, MD 09/26/21 1006

## 2021-09-25 NOTE — Discharge Instructions (Addendum)
Your work-up today was reassuring, your xrays did not show any significant abnormalities and were negative for fracture.  Blood work did not show elevated white blood cell count which could indicate infection.  Your pain significantly improved after dose of pain medication.  Your pain likely is musculoskeletal in nature.  I have sent in pain medication for you as well as naproxen for you to take for anti-inflammatory effect.  If you have worsening symptoms please return to the emergency room.  Otherwise I recommend you follow-up with your primary care provider for management.

## 2021-09-26 MED ORDER — HYDROCODONE-ACETAMINOPHEN 5-325 MG PO TABS: 1.0000 | ORAL_TABLET | Freq: Four times a day (QID) | ORAL | 0 refills | Status: AC | PRN

## 2021-09-26 MED ORDER — NAPROXEN 375 MG PO TABS: 375.0000 mg | ORAL_TABLET | Freq: Two times a day (BID) | ORAL | 0 refills | Status: AC

## 2021-09-26 MED ORDER — KETOROLAC TROMETHAMINE 15 MG/ML IJ SOLN
30.0000 mg | Freq: Once | INTRAMUSCULAR | Status: AC
  Administered 2021-09-26: 30 mg via INTRAMUSCULAR
  Filled 2021-09-26: qty 2

## 2021-09-26 NOTE — ED Notes (Signed)
Pt verbalized understanding of d/c instructions, meds, and followup care. Denies questions. VSS, no distress noted. Steady gait to exit with all belongings.  ?

## 2021-10-26 ENCOUNTER — Other Ambulatory Visit (HOSPITAL_COMMUNITY): Payer: Self-pay | Admitting: Internal Medicine

## 2021-10-26 ENCOUNTER — Other Ambulatory Visit: Payer: Self-pay | Admitting: Internal Medicine

## 2021-10-26 DIAGNOSIS — M79605 Pain in left leg: Secondary | ICD-10-CM

## 2021-10-27 LAB — COMPLETE METABOLIC PANEL WITH GFR
AG Ratio: 1 (calc) (ref 1.0–2.5)
ALT: 11 U/L (ref 6–29)
AST: 11 U/L (ref 10–35)
Albumin: 3.7 g/dL (ref 3.6–5.1)
Alkaline phosphatase (APISO): 52 U/L (ref 37–153)
BUN: 11 mg/dL (ref 7–25)
CO2: 21 mmol/L (ref 20–32)
Calcium: 9.3 mg/dL (ref 8.6–10.4)
Chloride: 106 mmol/L (ref 98–110)
Creat: 0.95 mg/dL (ref 0.50–1.03)
Globulin: 3.7 g/dL (calc) (ref 1.9–3.7)
Glucose, Bld: 103 mg/dL — ABNORMAL HIGH (ref 65–99)
Potassium: 3.9 mmol/L (ref 3.5–5.3)
Sodium: 140 mmol/L (ref 135–146)
Total Bilirubin: 0.6 mg/dL (ref 0.2–1.2)
Total Protein: 7.4 g/dL (ref 6.1–8.1)
eGFR: 73 mL/min/{1.73_m2} (ref >=60)

## 2021-10-27 LAB — CBC
HCT: 42.9 % (ref 35.0–45.0)
Hemoglobin: 14 g/dL (ref 11.7–15.5)
MCH: 30.5 pg (ref 27.0–33.0)
MCHC: 32.6 g/dL (ref 32.0–36.0)
MCV: 93.5 fL (ref 80.0–100.0)
MPV: 9.4 fL (ref 7.5–12.5)
Platelets: 450 10*3/uL — ABNORMAL HIGH (ref 140–400)
RBC: 4.59 10*6/uL (ref 3.80–5.10)
RDW: 12.6 % (ref 11.0–15.0)
WBC: 7.1 10*3/uL (ref 3.8–10.8)

## 2021-10-27 LAB — VITAMIN B12: Vitamin B-12: 559 pg/mL (ref 200–1100)

## 2021-10-27 LAB — LIPID PANEL
Cholesterol: 167 mg/dL (ref <200)
HDL: 39 mg/dL — ABNORMAL LOW (ref >=50)
LDL Cholesterol (Calc): 107 mg/dL (calc) — ABNORMAL HIGH
Non-HDL Cholesterol (Calc): 128 mg/dL (calc) (ref <130)
Total CHOL/HDL Ratio: 4.3 (calc) (ref <5.0)
Triglycerides: 110 mg/dL (ref <150)

## 2021-10-27 LAB — FOLATE: Folate: 9.3 ng/mL

## 2021-10-27 LAB — TSH: TSH: 0.8 mIU/L

## 2021-10-27 LAB — VITAMIN D 25 HYDROXY (VIT D DEFICIENCY, FRACTURES): Vit D, 25-Hydroxy: 73 ng/mL (ref 30–100)

## 2021-10-31 ENCOUNTER — Ambulatory Visit (HOSPITAL_COMMUNITY)
Admission: RE | Admit: 2021-10-31 | Discharge: 2021-10-31 | Disposition: A | Discharge status: Home / Self Care | Payer: Medicaid Other | Source: Ambulatory Visit | Attending: Internal Medicine | Admitting: Internal Medicine

## 2021-10-31 DIAGNOSIS — M79605 Pain in left leg: Secondary | ICD-10-CM | POA: Insufficient documentation

## 2021-10-31 NOTE — Progress Notes (Signed)
Left lower extremity venous duplex has been completed. ?Preliminary results can be found in CV Proc through chart review.  ?Results were faxed to Dr. Albertina Parr office. ? ?10/31/21 10:17 AM ?Olen Cordial RVT    ?

## 2021-12-13 ENCOUNTER — Other Ambulatory Visit: Payer: Self-pay | Admitting: Sports Medicine

## 2021-12-13 DIAGNOSIS — M545 Low back pain, unspecified: Secondary | ICD-10-CM

## 2021-12-24 ENCOUNTER — Other Ambulatory Visit: Payer: Medicaid Other

## 2021-12-29 ENCOUNTER — Other Ambulatory Visit: Payer: Medicaid Other

## 2022-07-13 ENCOUNTER — Ambulatory Visit (INDEPENDENT_AMBULATORY_CARE_PROVIDER_SITE_OTHER): Payer: Medicaid Other | Admitting: Obstetrics

## 2022-07-13 ENCOUNTER — Other Ambulatory Visit (HOSPITAL_COMMUNITY)
Admission: RE | Admit: 2022-07-13 | Discharge: 2022-07-13 | Disposition: A | Discharge status: Home / Self Care | Payer: Medicaid Other | Source: Ambulatory Visit | Attending: Obstetrics | Admitting: Obstetrics

## 2022-07-13 ENCOUNTER — Encounter: Payer: Self-pay | Admitting: Obstetrics

## 2022-07-13 VITALS — BP 145/89 | HR 70 | Ht 67.0 in | Wt 337.8 lb

## 2022-07-13 DIAGNOSIS — Z01419 Encounter for gynecological examination (general) (routine) without abnormal findings: Secondary | ICD-10-CM

## 2022-07-13 DIAGNOSIS — Z1239 Encounter for other screening for malignant neoplasm of breast: Secondary | ICD-10-CM

## 2022-07-13 DIAGNOSIS — Z6841 Body Mass Index (BMI) 40.0 and over, adult: Secondary | ICD-10-CM

## 2022-07-13 NOTE — Progress Notes (Signed)
Patient presents as a new patient for AEX. Patient has no concerns today. Denies having any vaginal discharge, odor, or irritations.   Last Pap: unknown Last Mm: 2019 Normal

## 2022-07-13 NOTE — Progress Notes (Signed)
Subjective:        Danielle Rojas is a 51 y.o. female here for a routine exam.  Current complaints: None.    Personal health questionnaire:  Is patient Ashkenazi Jewish, have a family history of breast and/or ovarian cancer: no Is there a family history of uterine cancer diagnosed at age < 4, gastrointestinal cancer, urinary tract cancer, family member who is a Personnel officer syndrome-associated carrier: no Is the patient overweight and hypertensive, family history of diabetes, personal history of gestational diabetes, preeclampsia or PCOS: no Is patient over 38, have PCOS,  family history of premature CHD under age 58, diabetes, smoke, have hypertension or peripheral artery disease:  no At any time, has a partner hit, kicked or otherwise hurt or frightened you?: no Over the past 2 weeks, have you felt down, depressed or hopeless?: no Over the past 2 weeks, have you felt little interest or pleasure in doing things?:no   Gynecologic History Patient's last menstrual period was 06/14/2022. Contraception: tubal ligation Last Pap: unknown   Results were: unknown Last mammogram: 2019. Results were: normal  Obstetric History OB History  Gravida Para Term Preterm AB Living  3         3  SAB IAB Ectopic Multiple Live Births          3    # Outcome Date GA Lbr Len/2nd Weight Sex Delivery Anes PTL Lv  3 Gravida           2 Gravida           1 [place]             Past Medical History:  Diagnosis Date   Anxiety    Depression    GERD (gastroesophageal reflux disease)    Hyperlipidemia    Hypertension    Obesity    Sleep apnea, unspecified 02/18/2019    Past Surgical History:  Procedure Laterality Date   GALL STONE REMOVAL     TUBAL LIGATION  1996     Current Outpatient Medications:    lisinopril (ZESTRIL) 10 MG tablet, Take 10 mg by mouth daily., Disp: , Rfl:    METOPROLOL TARTRATE PO, Take by mouth., Disp: , Rfl:    pantoprazole (PROTONIX) 40 MG tablet, Take 40 mg by mouth every  morning., Disp: , Rfl:    Sertraline HCl (ZOLOFT PO), Take by mouth., Disp: , Rfl:    SIMVASTATIN PO, Take by mouth., Disp: , Rfl:    Vitamin D, Ergocalciferol, (DRISDOL) 1.25 MG (50000 UNIT) CAPS capsule, Take 50,000 Units by mouth once a week., Disp: , Rfl:    HYDROcodone-acetaminophen (NORCO/VICODIN) 5-325 MG tablet, Take 1 tablet by mouth every 6 (six) hours as needed for severe pain., Disp: 12 tablet, Rfl: 0   naproxen (NAPROSYN) 375 MG tablet, Take 1 tablet (375 mg total) by mouth 2 (two) times daily. (Patient not taking: Reported on 07/13/2022), Disp: 20 tablet, Rfl: 0 No Known Allergies  Social History   Tobacco Use   Smoking status: Never   Smokeless tobacco: Never  Substance Use Topics   Alcohol use: No    Family History  Problem Relation Age of Onset   Diabetes Mother    Hypertension Mother       Review of Systems  Constitutional: negative for fatigue and weight loss Respiratory: negative for cough and wheezing Cardiovascular: negative for chest pain, fatigue and palpitations Gastrointestinal: negative for abdominal pain and change in bowel habits Musculoskeletal:negative for myalgias Neurological: negative for gait  problems and tremors Behavioral/Psych: negative for abusive relationship, depression Endocrine: negative for temperature intolerance    Genitourinary:negative for abnormal menstrual periods, genital lesions, hot flashes, sexual problems and vaginal discharge Integument/breast: negative for breast lump, breast tenderness, nipple discharge and skin lesion(s)    Objective:       BP (!) 145/89   Pulse 70   Ht 5\' 7"  (1.702 m)   Wt (!) 337 lb 12.8 oz (153.2 kg)   LMP 06/14/2022   BMI 52.91 kg/m  General:   Alert and no distress   Skin:   no rash or abnormalities  Lungs:   clear to auscultation bilaterally  Heart:   regular rate and rhythm, S1, S2 normal, no murmur, click, rub or gallop  Breasts:   normal without suspicious masses, skin or nipple  changes or axillary nodes  Abdomen:  normal findings: no organomegaly, soft, non-tender and no hernia  Pelvis:  External genitalia: normal general appearance Urinary system: urethral meatus normal and bladder without fullness, nontender Vaginal: normal without tenderness, induration or masses Cervix: normal appearance Adnexa: normal bimanual exam Uterus: anteverted and non-tender, normal size   Lab Review Urine pregnancy test Labs reviewed yes Radiologic studies reviewed yes  I have spent a total of 20 minutes of face-to-face time, excluding clinical staff time, reviewing notes and preparing to see patient, ordering tests and/or medications, and counseling the patient.   Assessment:    1. Encounter for gynecological examination with Papanicolaou smear of cervix Rx: - Cytology - PAP( [place])  2. Class 3 severe obesity due to excess calories without serious comorbidity with body mass index (BMI) of 50.0 to 59.9 in adult (HCC) - weight reduction recommended  3. Screening breast examination Rx: - MM Digital Screening; Future     Plan:    Education reviewed: calcium supplements, depression evaluation, low fat, low cholesterol diet, safe sex/STD prevention, self breast exams, smoking cessation, and weight bearing exercise. Contraception: tubal ligation. Mammogram ordered. Follow up in: 1 year.    Orders Placed This Encounter  Procedures   MM Digital Screening    Standing Status:   Future    Standing Expiration Date:   07/18/2023    Order Specific Question:   Reason for Exam (SYMPTOM  OR DIAGNOSIS REQUIRED)    Answer:   Screening    Order Specific Question:   Is the patient pregnant?    Answer:   No    Order Specific Question:   Preferred imaging location?    Answer:   [facility] A. [facility] MD 07/13/22

## 2022-07-18 LAB — CYTOLOGY - PAP
Comment: NEGATIVE
Diagnosis: NEGATIVE
High risk HPV: NEGATIVE
